# Patient Record
Sex: Female | Born: 1970
Health system: Northeastern US, Academic
[De-identification: ages and names within clinical notes are randomized; demographics above are authoritative.]

## PROBLEM LIST (undated history)

## (undated) DIAGNOSIS — F329 Major depressive disorder, single episode, unspecified: Secondary | ICD-10-CM

## (undated) DIAGNOSIS — E669 Obesity, unspecified: Secondary | ICD-10-CM

## (undated) DIAGNOSIS — M069 Rheumatoid arthritis, unspecified: Secondary | ICD-10-CM

## (undated) DIAGNOSIS — K219 Gastro-esophageal reflux disease without esophagitis: Secondary | ICD-10-CM

## (undated) DIAGNOSIS — D751 Secondary polycythemia: Secondary | ICD-10-CM

## (undated) DIAGNOSIS — N950 Postmenopausal bleeding: Secondary | ICD-10-CM

## (undated) DIAGNOSIS — F32A Depression, unspecified: Secondary | ICD-10-CM

## (undated) DIAGNOSIS — R0602 Shortness of breath: Secondary | ICD-10-CM

## (undated) DIAGNOSIS — R5383 Other fatigue: Secondary | ICD-10-CM

## (undated) DIAGNOSIS — F419 Anxiety disorder, unspecified: Secondary | ICD-10-CM

## (undated) HISTORY — PX: APPENDECTOMY: SHX54

## (undated) HISTORY — DX: Secondary polycythemia: D75.1

## (undated) HISTORY — DX: Major depressive disorder, single episode, unspecified: F32.9

## (undated) HISTORY — DX: Other fatigue: R53.83

## (undated) HISTORY — DX: Depression, unspecified: F32.A

## (undated) HISTORY — DX: Shortness of breath: R06.02

## (undated) HISTORY — PX: CHOLECYSTECTOMY: SHX55

## (undated) HISTORY — DX: Anxiety disorder, unspecified: F41.9

## (undated) HISTORY — DX: Obesity, unspecified: E66.9

## (undated) HISTORY — PX: OTHER SURGICAL HISTORY: SHX169

---
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
## 2000-01-13 HISTORY — PX: LAPAROSCOPIC UNILATERAL SALPINGECTOMY: SHX5934

## 2002-01-12 HISTORY — PX: HYSTEROSCOPY W/ ENDOMETRIAL ABLATION: SUR665

## 2003-07-06 ENCOUNTER — Ambulatory Visit (HOSPITAL_COMMUNITY): Admission: RE | Admit: 2003-07-06 | Discharge: 2003-07-06 | Payer: Self-pay | Admitting: Orthopaedic Surgery

## 2003-09-13 ENCOUNTER — Emergency Department (HOSPITAL_COMMUNITY): Admission: EM | Admit: 2003-09-13 | Discharge: 2003-09-14 | Payer: Self-pay | Admitting: *Deleted

## 2005-09-17 ENCOUNTER — Ambulatory Visit: Payer: Self-pay | Admitting: Oncology

## 2005-09-17 LAB — CBC WITH DIFFERENTIAL (CANCER CENTER ONLY)
BASO#: 0.1 10*3/uL (ref 0.0–0.2)
BASO%: 1.1 % (ref 0.0–2.0)
HCT: 45.6 % (ref 34.8–46.6)
HGB: 15.5 g/dL (ref 11.6–15.9)
LYMPH#: 2.9 10*3/uL (ref 0.9–3.3)
LYMPH%: 31.3 % (ref 14.0–48.0)
MCH: 29.3 pg (ref 26.0–34.0)
MCHC: 34 g/dL (ref 32.0–36.0)
MCV: 86 fL (ref 81–101)
MONO#: 0.4 10*3/uL (ref 0.1–0.9)
MONO%: 4.1 % (ref 0.0–13.0)
NEUT%: 61.4 % (ref 39.6–80.0)
Platelets: 306 10*3/uL (ref 145–400)
RBC: 5.3 10*6/uL (ref 3.70–5.32)
RDW: 11.5 % (ref 10.5–14.6)

## 2005-09-17 LAB — MORPHOLOGY - CHCC SATELLITE: Platelet Morphology: NORMAL

## 2005-09-18 LAB — COMPREHENSIVE METABOLIC PANEL
ALT: 31 U/L (ref 0–40)
AST: 21 U/L (ref 0–37)
Alkaline Phosphatase: 80 U/L (ref 39–117)
BUN: 9 mg/dL (ref 6–23)
CO2: 27 mEq/L (ref 19–32)
Glucose, Bld: 81 mg/dL (ref 70–99)
Total Protein: 7 g/dL (ref 6.0–8.3)

## 2005-09-18 LAB — ERYTHROPOIETIN: Erythropoietin: 7 m[IU]/mL (ref 2.6–34.0)

## 2005-09-18 LAB — IRON AND TIBC: %SAT: 23 % (ref 20–55)

## 2005-11-02 ENCOUNTER — Ambulatory Visit: Payer: Self-pay | Admitting: Oncology

## 2006-09-08 ENCOUNTER — Encounter: Payer: Self-pay | Admitting: Obstetrics & Gynecology

## 2006-09-08 ENCOUNTER — Ambulatory Visit: Payer: Self-pay | Admitting: Obstetrics & Gynecology

## 2006-10-13 ENCOUNTER — Ambulatory Visit: Payer: Self-pay | Admitting: Obstetrics & Gynecology

## 2007-12-26 DIAGNOSIS — D751 Secondary polycythemia: Secondary | ICD-10-CM | POA: Insufficient documentation

## 2007-12-26 DIAGNOSIS — Z8249 Family history of ischemic heart disease and other diseases of the circulatory system: Secondary | ICD-10-CM | POA: Insufficient documentation

## 2007-12-26 DIAGNOSIS — F329 Major depressive disorder, single episode, unspecified: Secondary | ICD-10-CM | POA: Insufficient documentation

## 2007-12-26 DIAGNOSIS — F32A Depression, unspecified: Secondary | ICD-10-CM | POA: Insufficient documentation

## 2007-12-26 DIAGNOSIS — Z803 Family history of malignant neoplasm of breast: Secondary | ICD-10-CM | POA: Insufficient documentation

## 2009-06-20 ENCOUNTER — Ambulatory Visit: Payer: Self-pay | Admitting: Obstetrics and Gynecology

## 2009-06-20 LAB — CONVERTED CEMR LAB

## 2009-07-16 ENCOUNTER — Ambulatory Visit: Payer: Self-pay | Admitting: Family Medicine

## 2009-07-29 ENCOUNTER — Ambulatory Visit: Payer: Self-pay | Admitting: Cardiovascular Disease

## 2009-07-29 DIAGNOSIS — R0602 Shortness of breath: Secondary | ICD-10-CM

## 2009-07-29 DIAGNOSIS — R5383 Other fatigue: Secondary | ICD-10-CM

## 2009-07-29 DIAGNOSIS — R079 Chest pain, unspecified: Secondary | ICD-10-CM | POA: Insufficient documentation

## 2009-08-13 ENCOUNTER — Ambulatory Visit: Payer: Self-pay | Admitting: Internal Medicine

## 2009-08-14 ENCOUNTER — Telehealth: Payer: Self-pay | Admitting: Cardiovascular Disease

## 2009-10-19 ENCOUNTER — Emergency Department: Payer: Self-pay | Admitting: Emergency Medicine

## 2009-12-31 ENCOUNTER — Ambulatory Visit: Payer: Self-pay | Admitting: Family Medicine

## 2010-02-02 ENCOUNTER — Encounter: Payer: Self-pay | Admitting: Orthopaedic Surgery

## 2010-02-06 ENCOUNTER — Emergency Department: Payer: Self-pay | Admitting: Emergency Medicine

## 2010-02-12 NOTE — Letter (Signed)
Summary: Historic Patient File  Historic Patient File   Imported By: [place] 07/30/2009 11:20:04  _____________________________________________________________________  External Attachment:    Type:   Image     Comment:   External Document

## 2010-02-12 NOTE — Assessment & Plan Note (Signed)
Summary: NP6/GLC   Visit Type:  new pt visit Primary Provider:  [facility]  CC:  chest pain...sob...denies any edema.  History of Present Illness: Meghan [name] 40 yo woman with no known CAD or cardiac hx, who smokes 1ppd for the past 20 yrs, significant family hx of CAD, presents with chest pain since last fall. She sees Dr. Nilda Simmer.  She reports her chest discomfort has been getting more intense over the past few months. She has up to 4 epsides a week, each episode lasting a few minutes. there is no activity that will inducxe her signs, rather they seem to come and go. Nothing seems to make it better or worse. recently, she had an epside of SOB with her chest discomfort. She describes the feeling as a pressure. She also has fatigue.  She is very stressed though this is nothing new for her. She has a 41 yo and a 54 yo child, works full time and manages all the bills. She works at [employer] full time.  She has been trying to quit smoking, was successful for two weeks, though started again.  No Labs available  EKG: NSR with rate 67 bpm, no significant ST or T wave changes.   Preventive Screening-Counseling & Management  Alcohol-Tobacco     Smoking Status: never  Caffeine-Diet-Exercise     Does Patient Exercise: yes      Drug Use:  no.    Current Medications (verified): 1)  Klonopin 1 Mg Tabs (Clonazepam) .Marland Kitchen.. 1 Tab Once Daily  Allergies (verified): 1)  ! Sulfa  Past History:  Family History: Last updated: 07/29/2009 Family History of Coronary Artery Disease: Father had 1st MI in his 20 yrs..had CABG and has defibrillator Family History of Hyperlipidemia: Mother Family History of Cancer: Mother Family History of Diabetes: Father Family History of Depression: Father  Social History: Last updated: 07/29/2009 Full Time Married  Tobacco Use - No.  Alcohol Use - no Regular Exercise - yes Drug Use - no  Risk Factors: Exercise: yes (07/29/2009)  Risk  Factors: Smoking Status: never (07/29/2009)  Past Medical History: FATIGUE (ICD-780.79) SHORTNESS OF BREATH (ICD-786.05) CHEST PAIN (ICD-786.50) Depression  Past Surgical History: Appendectomy Cholecystectomy Left salping/oopherectomy.Marland Kitchen.(ectopic pregnancy) Endometrial abalation/tubal ligation  Family History: Family History of Coronary Artery Disease: Father had 1st MI in his 20 yrs..had CABG and has defibrillator Family History of Hyperlipidemia: Mother Family History of Cancer: Mother Family History of Diabetes: Father Family History of Depression: Father  Social History: Full Time Married  Tobacco Use - No.  Alcohol Use - no Regular Exercise - yes Drug Use - no Smoking Status:  never Does Patient Exercise:  yes Drug Use:  no  Review of Systems       The patient complains of chest pain.  The patient denies fever, weight loss, weight gain, vision loss, decreased hearing, hoarseness, syncope, dyspnea on exertion, peripheral edema, prolonged cough, abdominal pain, incontinence, muscle weakness, depression, and enlarged lymph nodes.         Fatigue, rare SOB with chest pressure  Vital Signs:  Patient profile:   40 year old female Height:      66 inches Weight:      183 pounds BMI:     29.64 Pulse rate:   67 / minute Pulse rhythm:   regular BP sitting:   122 / 85  (left arm) Cuff size:   large  Vitals Entered By: Danielle Rankin, CMA (July 29, 2009 11:03 AM)  Physical Exam  General:  Well developed, well nourished, in no acute distress. Head:  normocephalic and atraumatic Neck:  Neck supple, no JVD. No masses, thyromegaly or abnormal cervical nodes. Lungs:  Clear bilaterally to auscultation and percussion. Heart:  Non-displaced PMI, chest non-tender; regular rate and rhythm, S1, S2 without murmurs, rubs or gallops. Carotid upstroke normal, no bruit.  Pedals normal pulses. No edema, no varicosities. Abdomen:  Bowel sounds positive; abdomen soft and non-tender  without masses Msk:  Back normal, normal gait. Muscle strength and tone normal. Pulses:  pulses normal in all 4 extremities Extremities:  No clubbing or cyanosis. Neurologic:  Alert and oriented x 3. Skin:  Intact without lesions or rashes. Psych:  Normal affect.   Problems:  Medical Problems Added: 1)  Dx of Fatigue  (ICD-780.79) 2)  Dx of Shortness of Breath  (ICD-786.05) 3)  Dx of Chest Pain  (ICD-786.50)  Impression & Recommendations:  Problem # 1:  CHEST PAIN (ICD-786.50) Assessment Improved Etiology of her pain is uncertain.There are some typical as well as atypical features of her symptoms.  Her risk factors include smoking and strong family hx. Will will set her up for a routine treadmill test.   Other differential could include esophageal spasm, coronary spasm, haital hernia, etc. If her treadmill is negative, we could try low dose imdur as needed or ca channel blockers. Medical management will be difficult as her signs do not last a long time and are rare. She prefers not to start medications.   Future Orders: Treadmill (Treadmill) ... 08/15/2009  Patient Instructions: 1)  Your physician recommends that you schedule a follow-up appointment in: as needed. 2)  Your physician has requested that you have an exercise tolerance test.  For further information please visit [email or web address]  Please also follow instruction sheet, as given. 3)  August 15, 2009 @ 7:30 a.m.

## 2010-02-12 NOTE — Progress Notes (Signed)
Summary: Reschedule ETT    Call placed by: Bishop Dublin, CMA,  August 14, 2009 8:45 AM Call placed to: Patient Details for Reason: reschedule [facility] ETT to August 22, 2009 Summary of Call: Need to let patient know we need to reschedule ETT at [facility] to August 22, 2009.  Initial call taken by: Bishop Dublin, CMA,  August 14, 2009 8:46 AM    Additional Follow-up for Phone Call Additional follow up Details #2::    that is ok.    Appended Document: Reschedule ETT [facility] rescheduled ETT for September 02, 2009 to arrive at 7:30 for a 8:00 stress test.  She is to call back if date okay.  Appended Document: Reschedule ETT Patient called to cancel her ETT for now because she is having so many test for pulmonolgy reasons and she will call back to reschedule.  She feels most of her problem is her lungs and she feels needs to deal with this issue first.

## 2010-04-17 ENCOUNTER — Ambulatory Visit: Payer: Self-pay | Admitting: Obstetrics & Gynecology

## 2010-04-28 ENCOUNTER — Ambulatory Visit: Payer: 59 | Admitting: Obstetrics & Gynecology

## 2010-04-28 ENCOUNTER — Ambulatory Visit: Payer: Self-pay | Admitting: Obstetrics & Gynecology

## 2010-04-28 DIAGNOSIS — N393 Stress incontinence (female) (male): Secondary | ICD-10-CM

## 2010-04-28 DIAGNOSIS — N949 Unspecified condition associated with female genital organs and menstrual cycle: Secondary | ICD-10-CM

## 2010-04-29 NOTE — Assessment & Plan Note (Signed)
NAME:  Meghan, [name] NO.:  [id number]  MEDICAL RECORD NO.:  [id number]           PATIENT TYPE:  LOCATION:  [facility] at [facility]           FACILITY:  PHYSICIAN:  Allie Bossier, MD             DATE OF BIRTH:  DATE OF SERVICE:  04/28/2010                                 CLINIC NOTE  Meghan [name] is a 40 year old married white G3, P2, ectopic one, she has 47 and 40 year old children and she comes in here today for followup of her pelvic pain.  She says her pelvic pain is consistently on the left.  It is intermittent.  She says it is more frequent when she is having vaginal bleeding.  Please note that she has a history of dysfunctional uterine bleeding and actually had an endometrial ablation in 2003, but she says her vaginal bleeding now occurs up to frequently at two times a month.  She has not had a recent ultrasound.  Dr. Okey Dupre saw her on June 20, 2009, and at that time, felt that she would benefit from a laparoscopy.  I agree with his assessment; however, I believe an ultrasound would be appropriate.  She also complains of excessive hair growth and would "like my hormone levels checked."  ASSESSMENT AND PLAN: 1. Pelvic pain.  I am ordering a GYN ultrasound and cervical cultures. 2. The patient appreciated hirsutism.  I am ordering a free     testosterone a TSH and an FSH.  When she comes back, I will review     the ultrasound and I plan to offer her laparoscopy at that time.  Please note, her main problem today is that of genuine stress urinary incontinence.  She says anytime she works out, she has to wear pads. She also has stress incontinence any time she laughs, coughs, and sneezes.  She has heard about a sling and would like one, so when I offer her a laparoscopy, I will certainly plan on doing an Advantage Fit sling.     Allie Bossier, MD    MCD/MEDQ  D:  04/28/2010  T:  04/29/2010  Job:  [id number]

## 2010-05-01 ENCOUNTER — Other Ambulatory Visit: Payer: Self-pay | Admitting: Obstetrics & Gynecology

## 2010-05-01 DIAGNOSIS — R102 Pelvic and perineal pain: Secondary | ICD-10-CM

## 2010-05-02 ENCOUNTER — Ambulatory Visit (HOSPITAL_COMMUNITY): Payer: 59

## 2010-05-02 ENCOUNTER — Ambulatory Visit (HOSPITAL_COMMUNITY)
Admission: RE | Admit: 2010-05-02 | Discharge: 2010-05-02 | Disposition: A | Discharge status: Home / Self Care | Payer: 59 | Source: Ambulatory Visit | Attending: Obstetrics & Gynecology | Admitting: Obstetrics & Gynecology

## 2010-05-02 DIAGNOSIS — R102 Pelvic and perineal pain: Secondary | ICD-10-CM

## 2010-05-27 NOTE — Assessment & Plan Note (Signed)
NAME:  Meghan [name], Meghan [name] NO.:  [id number]   MEDICAL RECORD NO.:  [id number]          PATIENT TYPE:  POB   LOCATION:  [facility] at [facility]         FACILITY:  [facility]   PHYSICIAN:  Argentina Donovan, MD        DATE OF BIRTH:  1970-05-16   DATE OF SERVICE:                                  CLINIC NOTE   The patient is a 40 year old Caucasian female gravida 3, para 2-0-1-2,  with a history of ectopic pregnancy and left salpingectomy as well as  sterilization by laparoscopic clip on her right ovary, endometrial  ablation, cholecystectomy, and appendectomy.  Recently diagnosed by a  psychologist with attention deficit hyperactivity disorder for which she  has not yet been placed on medication.  Medication she is on is Klonopin  and ibuprofen which is about she can take for her pain.  The pain is  intermittent but severe when it comes and more frequent when she is  having episodes of vaginal bleeding which she is getting about 2 times  per month.  The patient complains that most of this pain being in the  left lower quadrant and the last time she had a laparoscopy which was  when she had her tubal ligation done.  There was not any significant  signs of pelvic adhesions.  Since the pain is mainly coming with her  bleeding episodes, adenomyosis has to be certainly considered although  this would not explain why the pain is mainly on the left side.  Pelvic  congestion syndrome, endometriosis, and pelvic adhesions appearing since  her last laparoscopy are other considerations, and the patient is fairly  convinced that this is a gynecological etiology which I guess has to be  considered until proven otherwise.  She was previously scheduled for a  diagnostic laparoscopy and that was cancelled because the patient told  me she felt that she had been pushing the doctor to do that.  At this  point, I do not know of any other solution except a follow through with  that.   PHYSICAL  EXAMINATION:  VITAL SIGNS:  Today, the patient's blood pressure  was 118/89, her weight is 186, 5 feet 6 inches tall.  GENERAL:  Well-developed, well-nourished Caucasian female in no acute  distress.  HEENT:  Within normal limits.  NECK:  Supple.  Thyroid symmetrical.  No masses.  LUNGS:  Clear to auscultation and percussion.  BACK:  Erect with no CVA tenderness.  BREASTS:  Symmetrical.  No dominant masses, no nipple discharge.  No  supraclavicular or axillary nodes.  HEART:  No murmur.  Normal sinus rhythm.  ABDOMEN:  Soft, flat, nontender.  No masses, no organomegaly noted.  External genitalia is normal.  BUS within normal limits.  Vagina is  clean and well rugated.  Cervix is clean and parous.  Pap smear was  taken.  The uterus is anterior with normal size, shape, consistency.  The adnexa not well outlined, but does not seem to be abnormal.  Cul-de-  sac is free.  EXTREMITIES:  No edema.  No varicosities.  DTRs within normal limits.  Physical examination is generally normal.   IMPRESSION:  Chronic recurrent pelvic  pain with intermittent  dysfunctional uterine bleeding.  I am going to have the patient come  back and see other surgeons, so that she can probably be scheduled for a  diagnostic laparoscopy.  Until this is done, I do not think we can  possibly say that we do not have any evidence that this is of  gynecological  etiology, although I have my doubts the patient's mother had  hysterectomy for the same reason as did some other relatives.           ______________________________  Argentina Donovan, MD     PR/MEDQ  D:  06/20/2009  T:  06/21/2009  Job:  [id number]

## 2010-05-27 NOTE — H&P (Signed)
NAME:  Meghan [name], Meghan [name]             ACCOUNT NO.:  [id number]   MEDICAL RECORD NO.:  [id number]          PATIENT TYPE:  POB   LOCATION:  [facility] at [facility]         FACILITY:  [facility]   PHYSICIAN:  Johnella Moloney, MD        DATE OF BIRTH:  [birth year]   DATE OF SERVICE:  09/08/2006                           PRE-OP HISTORY & PHYSICAL   CHIEF COMPLAINT:  Pelvic pain.   HISTORY OF PRESENT ILLNESS:  Patient is a 40 year old G3, P2-0-1-2 who  comes to the office today complaining of worsening pelvic pain.  Of  note, the patient has a history of left salpingectomy for an ectopic  pregnancy in 2002 and a thermal endometrial ablation and right-sided  tubal binding in 2004.  The patient reports that she has had this pelvic  pain for over a year.  It initially started off as a dull pain that  occurred a few days prior to her period but has gotten worse to the  point that it becomes debilitated, on some days requiring 1600 mg of  Motrin every 6 hours as needed.  She also reports a throbbing on the  left side that is sometimes present when she is having these pain  episodes.  The patient does not notice that there is any clear  association with this pain, urinary or bowel symptoms, but does report  sometimes that the pain interferes with her abilities to defecate or  urinate.  Patient does say that the pain is related to when she is  getting her bleeding episodes, which do occur about every 2-3 weeks, at  which point she has a small amount of bleeding and this has been routine  since her endometrial ablation.  The patient is currently sexually  active with a monogamous partner and has no other symptoms.  She denies  any fever, chills, sweats, abnormal vaginal discharge, or any other  symptoms.   PAST MEDICAL HISTORY:  1. Polycythemia.  2. Anxiety.   PAST SURGICAL HISTORY:  1. Salpingectomy for ectopic ablation and right tubal banding.  2. Cholecystectomy.  3. Appendectomy.   MEDICATIONS:  1.  Klonopin 1 mg p.o. p.r.n.  2. Ibuprofen 800 mg p.o. p.r.n. pain.   ALLERGIES:  SULFA.   SOCIAL HISTORY:  Patient works as an Print production planner.  She has smoked  over one pack per day for the last 20 years.  She denies any illicit  drug use or alcohol use and denies any domestic violence.   FAMILY HISTORY:  Patient reports an extensive history of pelvic pain on  her maternal side, including her mother and all of her aunts, who all  had to get hysterectomies to deal with her pelvic pain and also reports  a brother who was schizophrenic and comitted suicide.  No GYN cancers  reported.  Of note, the patient has an extensive history of heart  disease with her father, who has had two quadruple bypass surgeries, and  according to the patient, has a very complicated heart disease history.   OB/GYN HISTORY:  She is G3, P2-0-1-2.  She has had two vaginal  deliveries.  She had a history of irregular heavy periods prior to her  endometrial  ablation and now has minimal bleeding every two weeks.  Patient reported having a Pap smear in February, 2007, which came back  as ASCUS negative, HPV, and no prior history of abnormal Pap smears.  Patient denies any sexually transmitted diseases.   PHYSICAL EXAMINATION:  Pulse 80, blood pressure 125/90.  Weight is  181.5.  Her last menstrual period was September 01, 2006.  GENERAL:  No apparent distress.  LUNGS:  Clear to auscultation bilaterally.  HEART:  Regular rate and rhythm.  ABDOMEN:  Soft.  Minimal tenderness on deep palpation diffusely.  No  rebound or guarding.  PELVIC:  Normal external female genitalia.  No abnormal vaginal  discharge.  Pap smear and GC Chlamydia sample obtained.  On bimanual  exam, anteverted uterus.  No cervical motion tenderness but just diffuse  tenderness on palpation of the uterus and bilateral adnexa.  No abnormal  masses palpated.   ASSESSMENT/PLAN:  Patient is a 40 year old G3, P2 with pelvic pain.  Patient's pain  description is concerning for endometriosis versus  adhesions from her prior surgery, especially on her left side. The  patient was counseled regarding treatment with different NSAIDs, but  patient does opt for surgical evaluation. Patient was counseled  regarding a diagnostic laparoscopy to evaluate for possible  endometriosis or pelvic adhesions, at which point these will be either  ablated or removed.  She was also talked to about the chance of doing  this surgery and not finding any etiology for her pain, and patient is  understanding of this possibility but wants to proceed with surgery.  The risks, benefits, indications, and alternatives to the exploratory  laparoscopy were discussed, and patient verbalized understanding of the  plan.  Will go ahead and schedule the patient for the laparoscopy and  preop order sheets will be sent over to [facility] for this effect.           ______________________________  Johnella Moloney, MD     UD/MEDQ  D:  09/08/2006  T:  09/08/2006  Job:  [id number]

## 2010-07-28 ENCOUNTER — Encounter: Payer: Self-pay | Admitting: Cardiovascular Disease

## 2010-08-13 ENCOUNTER — Other Ambulatory Visit (INDEPENDENT_AMBULATORY_CARE_PROVIDER_SITE_OTHER): Payer: 59 | Admitting: *Deleted

## 2010-08-13 DIAGNOSIS — N912 Amenorrhea, unspecified: Secondary | ICD-10-CM

## 2010-08-13 LAB — POCT URINE PREGNANCY: Preg Test, Ur: NEGATIVE

## 2010-08-13 NOTE — Progress Notes (Signed)
Patient has not had a cycle in several months, the last one she remembers is April or may. She has had an endometrial ablation but it was 10 years ago and has always had a cycle.  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

## 2010-09-08 ENCOUNTER — Telehealth: Payer: Self-pay | Admitting: *Deleted

## 2010-09-08 DIAGNOSIS — R1032 Left lower quadrant pain: Secondary | ICD-10-CM

## 2010-09-08 NOTE — Telephone Encounter (Signed)
Patient would like to move forward with Pelvic Ultrasound.

## 2010-09-19 ENCOUNTER — Ambulatory Visit (HOSPITAL_COMMUNITY)
Admission: RE | Admit: 2010-09-19 | Discharge: 2010-09-19 | Disposition: A | Discharge status: Home / Self Care | Payer: 59 | Source: Ambulatory Visit | Attending: Obstetrics and Gynecology | Admitting: Obstetrics and Gynecology

## 2010-09-19 DIAGNOSIS — N949 Unspecified condition associated with female genital organs and menstrual cycle: Secondary | ICD-10-CM | POA: Insufficient documentation

## 2010-09-19 DIAGNOSIS — D251 Intramural leiomyoma of uterus: Secondary | ICD-10-CM | POA: Insufficient documentation

## 2010-09-19 DIAGNOSIS — R1032 Left lower quadrant pain: Secondary | ICD-10-CM

## 2010-10-06 ENCOUNTER — Encounter: Payer: Self-pay | Admitting: Obstetrics & Gynecology

## 2010-10-06 ENCOUNTER — Ambulatory Visit (INDEPENDENT_AMBULATORY_CARE_PROVIDER_SITE_OTHER): Payer: 59 | Admitting: Obstetrics & Gynecology

## 2010-10-06 VITALS — BP 111/81 | HR 64 | Ht 66.0 in | Wt 194.0 lb

## 2010-10-06 DIAGNOSIS — N949 Unspecified condition associated with female genital organs and menstrual cycle: Secondary | ICD-10-CM

## 2010-10-06 DIAGNOSIS — N943 Premenstrual tension syndrome: Secondary | ICD-10-CM

## 2010-10-06 DIAGNOSIS — R32 Unspecified urinary incontinence: Secondary | ICD-10-CM

## 2010-10-06 NOTE — Progress Notes (Signed)
Here today to discuss possible surgery and review pelvic ultrasound.

## 2010-10-06 NOTE — Progress Notes (Signed)
  Subjective:    Patient ID: Meghan [name], female    DOB: [birth year], 40 y.o.   MRN: [id number]  HPI Ms. Garlitz is here to discuss her incontinence and left pelvic pain.  She is interested in a sling and laparoscopy.  Upon further discussion of her incontinence, it seems that probably half of her incontinence episodes are from urge incontinence.     Review of Systems She says that her DUB seems to have resolved.  She has only had one period in the last 4 months    Objective:   Physical Exam Generous pubic arch Uterus ULN size, NT, mobile, AV Adnexa non-tender and no masses       Assessment & Plan:  Mixed incontinence- Prior to a sling, I would like for her to have a urology consult When she does go to the OR, I will do a laparoscopy and look for a reason for her pain.

## 2011-04-06 ENCOUNTER — Ambulatory Visit (INDEPENDENT_AMBULATORY_CARE_PROVIDER_SITE_OTHER): Payer: 59 | Admitting: Obstetrics & Gynecology

## 2011-04-06 ENCOUNTER — Encounter: Payer: Self-pay | Admitting: Obstetrics & Gynecology

## 2011-04-06 VITALS — BP 131/90 | HR 80 | Ht 66.0 in | Wt 204.0 lb

## 2011-04-06 DIAGNOSIS — N939 Abnormal uterine and vaginal bleeding, unspecified: Secondary | ICD-10-CM

## 2011-04-06 DIAGNOSIS — N938 Other specified abnormal uterine and vaginal bleeding: Secondary | ICD-10-CM

## 2011-04-06 NOTE — Progress Notes (Signed)
  Subjective:    Patient ID: Meghan [name], female    DOB: [birth year], 41 y.o.   MRN: [id number]  HPI Meghan [name] is a 41 yo lady status post endometrial ablation 2003. Her periods have continued since the ablation but have become erratic. She skipped her period for 4 months and now has been bleeding for the last 3 weeks. She is at the point that she would like a "partial hysterectomy" and a sling at the same time.   Review of Systems U/s normal 9/12    Objective:   Physical Exam   ULN size, mobile, NT, no adnexal masses.     Assessment & Plan:  DUB after ablation. I will check CBC and TSH. She will go to the urologist for a consult.

## 2011-04-06 NOTE — Progress Notes (Signed)
Addended by: Barbara Cower on: 04/06/2011 04:40 PM   Modules accepted: Orders

## 2011-04-06 NOTE — Progress Notes (Signed)
No period for four months then bled for a week the end of feb beginning of March. She stopped bleeding for a week then started back and has bled up until today.  She has had an ablation, and is interested in surgical management at this point.  She also loses urine with coughing and sneezing.  She has also gained 10  Pounds since her visit in September.  She has stopped smoking completely.  She has started exercising as well but the weight is not budging.

## 2011-04-06 NOTE — Progress Notes (Signed)
Addended by: Barbara Cower on: 04/06/2011 04:05 PM   Modules accepted: Orders

## 2011-06-18 ENCOUNTER — Encounter: Payer: 59 | Attending: Obstetrics & Gynecology | Admitting: *Deleted

## 2011-06-18 ENCOUNTER — Encounter: Payer: Self-pay | Admitting: *Deleted

## 2011-06-18 DIAGNOSIS — E669 Obesity, unspecified: Secondary | ICD-10-CM | POA: Insufficient documentation

## 2011-06-18 DIAGNOSIS — Z713 Dietary counseling and surveillance: Secondary | ICD-10-CM | POA: Insufficient documentation

## 2011-06-18 NOTE — Patient Instructions (Signed)
Plan: Aim for 3 Carb Choices (45 grams) per meal +/- 1 either way Read food labels for total carb of foods Consider ways to increase activity level, perhaps kayaking! Consider dancing at home 15 minutes every day

## 2011-06-18 NOTE — Progress Notes (Signed)
  Medical Nutrition Therapy:  Appt start time: 1615 end time:  1715.  Assessment:  Primary concerns today: patient here for assistance with weight loss after quitting smoking about 18 months ago. 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 She is happily busy with errands, her church and kids' activities after work. She feels she eats healthy foods, but would like direction as to better eating & activity habits.  MEDICATIONS: see list   DIETARY INTAKE:  Usual eating pattern includes 3 meals and 2+ snacks per day.  Everyday foods include good variety of all food groups.  Avoided foods include high fat or sweet foods.    24-hr recall:  B ( AM): banana or other fruit, yogurt occasionally, English Muffin with 1 Tbsp PNB, coffee with Sweet and Low, Cream throughout the day  Snk ( AM): graze while working; raw vegetables, Cheese Puff, Cheez-its,  L ( PM): bring from home: [place] burger with Pita bread, cheese, no condiments OR tomato Mozzarella salad Snk ( PM): same as AM D ( PM): lean meat, vegetables, occasioanally starch Snk ( PM): none Beverages: coffee, water sometimes with Crystal Light  Usual physical activity: walk outside in neighborhood occasionally  Estimated energy needs: 1400 calories 158 g carbohydrates 105 g protein 39 g fat  Progress Towards Goal(s):  In progress.   Nutritional Diagnosis:  NI-1.5 Excessive energy intake As related to activity level.  As evidenced by BMI of 33.9.    Intervention:  Nutrition counseling provided. Taught Carb Counting and how to read food label. Also discussed options for increasing her activity level including kayaking which she has done recently. May consider dancing at home as well Plan: Aim for 3 Carb Choices (45 grams) per meal +/- 1 either way Read food labels for total carb of foods Consider ways to increase activity level, perhaps kayaking! Consider dancing at home 15 minutes every  day  Handouts given during visit include: Carb Counting and Food Label handouts Meal Plan Card [facility] Calendar  Monitoring/Evaluation:  Dietary intake, exercise, reading food labels, and body weight in 3 week(s).

## 2011-07-07 ENCOUNTER — Ambulatory Visit: Payer: 59 | Admitting: *Deleted

## 2011-08-10 ENCOUNTER — Other Ambulatory Visit (INDEPENDENT_AMBULATORY_CARE_PROVIDER_SITE_OTHER): Payer: 59 | Admitting: *Deleted

## 2011-08-10 DIAGNOSIS — N912 Amenorrhea, unspecified: Secondary | ICD-10-CM

## 2011-08-12 LAB — LUTEINIZING HORMONE: LH: 18.7 m[IU]/mL

## 2011-08-28 ENCOUNTER — Encounter: Payer: Self-pay | Admitting: Obstetrics & Gynecology

## 2011-10-27 ENCOUNTER — Ambulatory Visit: Payer: 59 | Admitting: Obstetrics & Gynecology

## 2011-11-24 ENCOUNTER — Encounter: Payer: Self-pay | Admitting: Obstetrics & Gynecology

## 2011-11-24 ENCOUNTER — Ambulatory Visit (INDEPENDENT_AMBULATORY_CARE_PROVIDER_SITE_OTHER): Payer: 59 | Admitting: Obstetrics & Gynecology

## 2011-11-24 VITALS — BP 130/93 | HR 75 | Ht 66.0 in | Wt 203.0 lb

## 2011-11-24 DIAGNOSIS — Z1231 Encounter for screening mammogram for malignant neoplasm of breast: Secondary | ICD-10-CM

## 2011-11-24 DIAGNOSIS — Z1151 Encounter for screening for human papillomavirus (HPV): Secondary | ICD-10-CM

## 2011-11-24 DIAGNOSIS — Z0000 Encounter for general adult medical examination without abnormal findings: Secondary | ICD-10-CM

## 2011-11-24 DIAGNOSIS — R635 Abnormal weight gain: Secondary | ICD-10-CM

## 2011-11-24 DIAGNOSIS — Z124 Encounter for screening for malignant neoplasm of cervix: Secondary | ICD-10-CM

## 2011-11-24 DIAGNOSIS — Z01419 Encounter for gynecological examination (general) (routine) without abnormal findings: Secondary | ICD-10-CM

## 2011-11-24 MED ORDER — NORETHIN ACE-ETH ESTRAD-FE 1-20 MG-MCG(24) PO TABS: 1.0000 | ORAL_TABLET | Freq: Every day | ORAL | Status: DC

## 2011-11-24 NOTE — Progress Notes (Signed)
Subjective:    Meghan [name] is a 41 y.o. female who presents for an annual exam. She is very concerned about her weight gain/inability to lose the weight she desires. She has lost 6 pounds in the last 6 months. She has seen a nutritionist also. She is also discouraged because, even though she had an endometrial ablation in 2003, she continues to have 5-7 day "heavy" periods monthly.  The patient is sexually active. GYN screening history: last pap: was normal. The patient wears seatbelts: yes. The patient participates in regular exercise: yes. Has the patient ever been transfused or tattooed?: no. The patient reports that there is not domestic violence in her life.   Menstrual History: OB History    Grav Para Term Preterm Abortions TAB SAB Ect Mult Living   2 2              Menarche age: 32 Patient's last menstrual period was 10/15/2011.    The following portions of the patient's history were reviewed and updated as appropriate: allergies, current medications, past family history, past medical history, past social history, past surgical history and problem list.  Review of Systems A comprehensive review of systems was negative.    Objective:    BP 130/93  Pulse 75  Ht 5\' 6"  (1.676 m)  Wt 203 lb (92.08 kg)  BMI 32.76 kg/m2  LMP 10/15/2011  General Appearance:    Alert, cooperative, no distress, appears stated age  Head:    Normocephalic, without obvious abnormality, atraumatic  Eyes:    PERRL, conjunctiva/corneas clear, EOM's intact, fundi    benign, both eyes  Ears:    Normal TM's and external ear canals, both ears  Nose:   Nares normal, septum midline, mucosa normal, no drainage    or sinus tenderness  Throat:   Lips, mucosa, and tongue normal; teeth and gums normal  Neck:   Supple, symmetrical, trachea midline, no adenopathy;    thyroid:  no enlargement/tenderness/nodules; no carotid   bruit or JVD  Back:     Symmetric, no curvature, ROM normal, no CVA tenderness  Lungs:      Clear to auscultation bilaterally, respirations unlabored  Chest Wall:    No tenderness or deformity   Heart:    Regular rate and rhythm, S1 and S2 normal, no murmur, rub   or gallop  Breast Exam:    No tenderness, masses, or nipple abnormality  Abdomen:     Soft, non-tender, bowel sounds active all four quadrants,    no masses, no organomegaly  Genitalia:    Normal female without lesion, discharge or tenderness, NSSA, NT, mobile, non-enlarged, non-tender adnexa     Extremities:   Extremities normal, atraumatic, no cyanosis or edema  Pulses:   2+ and symmetric all extremities  Skin:   Skin color, texture, turgor normal, no rashes or lesions  Lymph nodes:   Cervical, supraclavicular, and axillary nodes normal  Neurologic:   CNII-XII intact, normal strength, sensation and reflexes    throughout  .    Assessment:    Healthy female exam.    Plan:     Mammogram. Thin prep Pap smear. referral to Dr. Cathey Endow at the [facility] in [place]  I will start her on OCPs to try to regulate/lighten her periods. BP check and labs in 1 week

## 2011-12-04 ENCOUNTER — Other Ambulatory Visit: Payer: 59

## 2012-01-21 ENCOUNTER — Ambulatory Visit (HOSPITAL_COMMUNITY): Admission: RE | Admit: 2012-01-21 | Payer: 59 | Source: Ambulatory Visit

## 2012-04-21 ENCOUNTER — Other Ambulatory Visit (INDEPENDENT_AMBULATORY_CARE_PROVIDER_SITE_OTHER): Payer: 59 | Admitting: *Deleted

## 2012-04-21 DIAGNOSIS — Z13220 Encounter for screening for lipoid disorders: Secondary | ICD-10-CM

## 2012-04-21 DIAGNOSIS — R5383 Other fatigue: Secondary | ICD-10-CM

## 2012-04-23 LAB — LIPID PANEL: Cholesterol, Total: 177 mg/dL (ref 100–199)

## 2012-04-23 LAB — COMPREHENSIVE METABOLIC PANEL
Albumin: 4.4 g/dL (ref 3.5–5.5)
Alkaline Phosphatase: 97 IU/L (ref 39–117)
BUN/Creatinine Ratio: 10 (ref 9–23)
BUN: 9 mg/dL (ref 6–24)
CO2: 26 mmol/L (ref 19–28)
Creatinine, Ser: 0.86 mg/dL (ref 0.57–1.00)
GFR calc Af Amer: 96 mL/min/{1.73_m2} (ref >59)
GFR calc non Af Amer: 84 mL/min/{1.73_m2} (ref >59)
Globulin, Total: 4.4 g/dL (ref 1.5–4.5)
Total Bilirubin: 0.3 mg/dL (ref 0.0–1.2)

## 2012-04-23 LAB — CBC WITH DIFFERENTIAL/PLATELET
Basos: 0 % (ref 0–3)
Eosinophils Absolute: 0.1 10*3/uL (ref 0.0–0.4)
Immature Grans (Abs): 0 10*3/uL (ref 0.0–0.1)
MCH: 28.7 pg (ref 26.6–33.0)
MCV: 86 fL (ref 79–97)
Monocytes Absolute: 0.4 10*3/uL (ref 0.1–0.9)
Neutrophils Relative %: 63 % (ref 40–74)
RBC: 5.08 x10E6/uL (ref 3.77–5.28)

## 2012-04-23 LAB — TSH: TSH: 1.28 u[IU]/mL (ref 0.450–4.500)

## 2012-04-26 ENCOUNTER — Telehealth: Payer: Self-pay | Admitting: *Deleted

## 2012-04-26 DIAGNOSIS — E559 Vitamin D deficiency, unspecified: Secondary | ICD-10-CM

## 2012-04-26 MED ORDER — VITAMIN D (ERGOCALCIFEROL) 1.25 MG (50000 UNIT) PO CAPS: 50000.0000 [IU] | ORAL_CAPSULE | ORAL | Status: DC

## 2012-04-26 NOTE — Telephone Encounter (Signed)
Patient is calling for lab results.  Notified of abnormal vitamin D level and we will call in rx for Vitamin D 10,000 units.

## 2012-05-02 ENCOUNTER — Encounter: Payer: Self-pay | Admitting: *Deleted

## 2013-04-26 ENCOUNTER — Telehealth: Payer: Self-pay

## 2013-04-26 NOTE — Telephone Encounter (Signed)
Called in Dyflucan 150 mg tab to [facility] for a yeast infection she developed while on a antibiotic treatment. Symptoms itchy and thick white discharge were present.

## 2013-05-29 ENCOUNTER — Ambulatory Visit (INDEPENDENT_AMBULATORY_CARE_PROVIDER_SITE_OTHER): Payer: Private Health Insurance - Indemnity | Admitting: Obstetrics & Gynecology

## 2013-05-29 ENCOUNTER — Encounter: Payer: Self-pay | Admitting: Obstetrics & Gynecology

## 2013-05-29 VITALS — BP 109/90 | HR 100 | Ht 66.0 in | Wt 187.8 lb

## 2013-05-29 DIAGNOSIS — Z0000 Encounter for general adult medical examination without abnormal findings: Secondary | ICD-10-CM

## 2013-05-29 DIAGNOSIS — Z124 Encounter for screening for malignant neoplasm of cervix: Secondary | ICD-10-CM

## 2013-05-29 DIAGNOSIS — Z01419 Encounter for gynecological examination (general) (routine) without abnormal findings: Secondary | ICD-10-CM

## 2013-05-29 MED ORDER — EFLORNITHINE HCL 13.9 % EX CREA: TOPICAL_CREAM | CUTANEOUS | Status: DC

## 2013-05-29 NOTE — Progress Notes (Signed)
Subjective:    Meghan [name] is a 43 y.o. female who presents for an annual exam. She has not had a period since 12/14 ([place]). The patient has no complaints today. The patient is sexually active. GYN screening history: last pap: was normal. The patient wears seatbelts: yes. The patient participates in regular exercise: no. Has the patient ever been transfused or tattooed?: no. The patient reports that there is not domestic violence in her life. She has [place] ancestry and has facial hair that requires plucking.  Menstrual History: OB History   Grav Para Term Preterm Abortions TAB SAB Ect Mult Living   2 2              Menarche age: 28  No LMP recorded. Patient has had an ablation.    The following portions of the patient's history were reviewed and updated as appropriate: allergies, current medications, past family history, past medical history, past social history, past surgical history and problem list.  Review of Systems A comprehensive review of systems was negative. Married for 10 years, monogamous for 17 years. Works at [employer], needs mammogram   Objective:    BP 109/90  Pulse 100  Ht 5\' 6"  (1.676 m)  Wt 187 lb 12.8 oz (85.186 kg)  BMI 30.33 kg/m2  General Appearance:    Alert, cooperative, no distress, appears stated age  Head:    Normocephalic, without obvious abnormality, atraumatic  Eyes:    PERRL, conjunctiva/corneas clear, EOM's intact, fundi    benign, both eyes  Ears:    Normal TM's and external ear canals, both ears  Nose:   Nares normal, septum midline, mucosa normal, no drainage    or sinus tenderness  Throat:   Lips, mucosa, and tongue normal; teeth and gums normal  Neck:   Supple, symmetrical, trachea midline, no adenopathy;    thyroid:  no enlargement/tenderness/nodules; no carotid   bruit or JVD  Back:     Symmetric, no curvature, ROM normal, no CVA tenderness  Lungs:     Clear to auscultation bilaterally, respirations unlabored  Chest Wall:     No tenderness or deformity   Heart:    Regular rate and rhythm, S1 and S2 normal, no murmur, rub   or gallop  Breast Exam:    No tenderness, masses, or nipple abnormality  Abdomen:     Soft, non-tender, bowel sounds active all four quadrants,    no masses, no organomegaly  Genitalia:    Normal female without lesion, discharge or tenderness, shaved, NSSA, NT, mobile, normal adnexal exam     Extremities:   Extremities normal, atraumatic, no cyanosis or edema  Pulses:   2+ and symmetric all extremities  Skin:   Skin color, texture, turgor normal, no rashes or lesions  Lymph nodes:   Cervical, supraclavicular, and axillary nodes normal  Neurologic:   CNII-XII intact, normal strength, sensation and reflexes    throughout  .    Assessment:    Healthy female exam.  Hirsuitism   Plan:     Breast self exam technique reviewed and patient encouraged to perform self-exam monthly. Mammogram. Thin prep Pap smear. with cotesting [place]

## 2013-06-05 LAB — PAP IG, CT-NG NAA, HPV HIGH-RISK
CHLAMYDIA, NUC. ACID AMP: NEGATIVE
GONOCOCCUS BY NUCLEIC ACID AMP: NEGATIVE

## 2013-11-13 ENCOUNTER — Encounter: Payer: Self-pay | Admitting: Obstetrics & Gynecology

## 2013-11-13 ENCOUNTER — Telehealth: Payer: Self-pay | Admitting: *Deleted

## 2013-11-13 DIAGNOSIS — N390 Urinary tract infection, site not specified: Secondary | ICD-10-CM

## 2013-11-13 MED ORDER — CEPHALEXIN 500 MG PO CAPS: 500.0000 mg | ORAL_CAPSULE | Freq: Three times a day (TID) | ORAL | Status: DC

## 2013-11-13 NOTE — Telephone Encounter (Signed)
Patient has a UTI and her supervisor is out of the office so she is unable to leave work to come in to be seen.  She would like something called in for her.

## 2014-04-18 ENCOUNTER — Encounter: Payer: Self-pay | Admitting: Physician Assistant

## 2014-04-18 ENCOUNTER — Ambulatory Visit (INDEPENDENT_AMBULATORY_CARE_PROVIDER_SITE_OTHER): Payer: 59 | Admitting: Physician Assistant

## 2014-04-18 VITALS — BP 135/87 | HR 82 | Ht 66.0 in

## 2014-04-18 DIAGNOSIS — N83202 Unspecified ovarian cyst, left side: Secondary | ICD-10-CM | POA: Insufficient documentation

## 2014-04-18 DIAGNOSIS — N8320 Unspecified ovarian cysts: Secondary | ICD-10-CM

## 2014-04-18 DIAGNOSIS — R102 Pelvic and perineal pain: Secondary | ICD-10-CM

## 2014-04-18 LAB — POCT URINALYSIS DIPSTICK
Bilirubin, UA: NEGATIVE
Blood, UA: NEGATIVE
GLUCOSE UA: NEGATIVE
Ketones, UA: NEGATIVE
LEUKOCYTES UA: NEGATIVE
Nitrite, UA: NEGATIVE
Protein, UA: NEGATIVE
UROBILINOGEN UA: NEGATIVE
pH, UA: 6

## 2014-04-18 MED ORDER — KETOROLAC TROMETHAMINE 60 MG/2ML IM SOLN
60.0000 mg | Freq: Once | INTRAMUSCULAR | Status: AC
  Administered 2014-04-18: 60 mg via INTRAMUSCULAR

## 2014-04-18 MED ORDER — HYDROCODONE-ACETAMINOPHEN 5-325 MG PO TABS: 1.0000 | ORAL_TABLET | Freq: Four times a day (QID) | ORAL | Status: DC | PRN

## 2014-04-18 MED ORDER — IBUPROFEN 800 MG PO TABS: 800.0000 mg | ORAL_TABLET | Freq: Three times a day (TID) | ORAL | Status: DC | PRN

## 2014-04-18 MED ORDER — ONDANSETRON 8 MG PO TBDP: 8.0000 mg | ORAL_TABLET | Freq: Three times a day (TID) | ORAL | Status: DC | PRN

## 2014-04-18 NOTE — Patient Instructions (Signed)
Ovarian Cyst An ovarian cyst is a fluid-filled sac that forms on an ovary. The ovaries are small organs that produce eggs in women. Various types of cysts can form on the ovaries. Most are not cancerous. Many do not cause problems, and they often go away on their own. Some may cause symptoms and require treatment. Common types of ovarian cysts include:  Functional cysts--These cysts may occur every month during the menstrual cycle. This is normal. The cysts usually go away with the next menstrual cycle if the woman does not get pregnant. Usually, there are no symptoms with a functional cyst.  Endometrioma cysts--These cysts form from the tissue that lines the uterus. They are also called "chocolate cysts" because they become filled with blood that turns brown. This type of cyst can cause pain in the lower abdomen during intercourse and with your menstrual period.  Cystadenoma cysts--This type develops from the cells on the outside of the ovary. These cysts can get very big and cause lower abdomen pain and pain with intercourse. This type of cyst can twist on itself, cut off its blood supply, and cause severe pain. It can also easily rupture and cause a lot of pain.  Dermoid cysts--This type of cyst is sometimes found in both ovaries. These cysts may contain different kinds of body tissue, such as skin, teeth, hair, or cartilage. They usually do not cause symptoms unless they get very big.  Theca lutein cysts--These cysts occur when too much of a certain hormone (human chorionic gonadotropin) is produced and overstimulates the ovaries to produce an egg. This is most common after procedures used to assist with the conception of a baby (in vitro fertilization). CAUSES   Fertility drugs can cause a condition in which multiple large cysts are formed on the ovaries. This is called ovarian hyperstimulation syndrome.  A condition called polycystic ovary syndrome can cause hormonal imbalances that can lead to  nonfunctional ovarian cysts. SIGNS AND SYMPTOMS  Many ovarian cysts do not cause symptoms. If symptoms are present, they may include:  Pelvic pain or pressure.  Pain in the lower abdomen.  Pain during sexual intercourse.  Increasing girth (swelling) of the abdomen.  Abnormal menstrual periods.  Increasing pain with menstrual periods.  Stopping having menstrual periods without being pregnant. DIAGNOSIS  These cysts are commonly found during a routine or annual pelvic exam. Tests may be ordered to find out more about the cyst. These tests may include:  Ultrasound.  X-ray of the pelvis.  CT scan.  MRI.  Blood tests. TREATMENT  Many ovarian cysts go away on their own without treatment. Your health care provider may want to check your cyst regularly for 2-3 months to see if it changes. For women in menopause, it is particularly important to monitor a cyst closely because of the higher rate of ovarian cancer in menopausal women. When treatment is needed, it may include any of the following:  A procedure to drain the cyst (aspiration). This may be done using a long needle and ultrasound. It can also be done through a laparoscopic procedure. This involves using a thin, lighted tube with a tiny camera on the end (laparoscope) inserted through a small incision.  Surgery to remove the whole cyst. This may be done using laparoscopic surgery or an open surgery involving a larger incision in the lower abdomen.  Hormone treatment or birth control pills. These methods are sometimes used to help dissolve a cyst. HOME CARE INSTRUCTIONS   Only take over-the-counter   or prescription medicines as directed by your health care provider.  Follow up with your health care provider as directed.  Get regular pelvic exams and Pap tests. SEEK MEDICAL CARE IF:   Your periods are late, irregular, or painful, or they stop.  Your pelvic pain or abdominal pain does not go away.  Your abdomen becomes  larger or swollen.  You have pressure on your bladder or trouble emptying your bladder completely.  You have pain during sexual intercourse.  You have feelings of fullness, pressure, or discomfort in your stomach.  You lose weight for no apparent reason.  You feel generally ill.  You become constipated.  You lose your appetite.  You develop acne.  You have an increase in body and facial hair.  You are gaining weight, without changing your exercise and eating habits.  You think you are pregnant. SEEK IMMEDIATE MEDICAL CARE IF:   You have increasing abdominal pain.  You feel sick to your stomach (nauseous), and you throw up (vomit).  You develop a fever that comes on suddenly.  You have abdominal pain during a bowel movement.  Your menstrual periods become heavier than usual. MAKE SURE YOU:  Understand these instructions.  Will watch your condition.  Will get help right away if you are not doing well or get worse. Document Released: 12/29/2004 Document Revised: 01/03/2013 Document Reviewed: 09/05/2012 ExitCare Patient Information 2015 [employer]. This information is not intended to replace advice given to you by your health care provider. Make sure you discuss any questions you have with your health care provider.  

## 2014-04-18 NOTE — Progress Notes (Signed)
Patient ID: Meghan [name], female   DOB: 02-Oct-1970, 44 y.o.   MRN: [id number] History:  Meghan [name] is a 44 y.o. G2P2 who presents to clinic today for pelvic pain present x 10 days without much change.  Period are highly irregular x greater than one year.  No recent strenuous physical activity/unusual sexual activity.  Not associated with eating/bathroom problems.  Denies vaginal bleeding, fever, weakness, nausea, vomiting, dizziness.    The following portions of the patient's history were reviewed and updated as appropriate: allergies, current medications, past family history, past medical history, past social history, past surgical history and problem list.  Review of Systems:  Pertinent items are noted in HPI.  Objective:  Physical Exam BP 135/87 mmHg  Pulse 82  Ht 5\' 6"  (1.676 m) GENERAL: Well-developed, well-nourished female in no acute distress.  LUNGS: Normal rate. No respiratory distress HEART: Regular rate  ABDOMEN: Soft, nondistended.  Tender to palpation across lower abdomen.    Labs and Imaging U/S done in clinic showing cyst 1.42cm on left ovary.    Assessment & Plan:  Assessment: 1. Pelvic pain in female   2. Ovarian cyst, left     Plans: IM Toradol in office Rx for Ibuprofen, Vicodin, Zofran. (zofran because pain meds cause nausea).  If no improvement, return to clinic.  If worsening, with severe pain - go to emergency room.  Return to annual exam next month.  Paticia Stack, PA-C 04/18/2014 3:19 PM

## 2014-04-18 NOTE — Progress Notes (Signed)
Bedside ultrasound shows Left ovarian simple cyst measuring 1.42cm.  Several other smaller follicles.  Patients discomfort is all along her lower pelvic region and has been going on for a full week now.

## 2014-08-28 ENCOUNTER — Ambulatory Visit (INDEPENDENT_AMBULATORY_CARE_PROVIDER_SITE_OTHER): Payer: 59 | Admitting: Obstetrics & Gynecology

## 2014-08-28 ENCOUNTER — Encounter: Payer: Self-pay | Admitting: Obstetrics & Gynecology

## 2014-08-28 VITALS — BP 135/89 | HR 82 | Wt 190.0 lb

## 2014-08-28 DIAGNOSIS — R102 Pelvic and perineal pain: Secondary | ICD-10-CM

## 2014-08-28 DIAGNOSIS — Z113 Encounter for screening for infections with a predominantly sexual mode of transmission: Secondary | ICD-10-CM

## 2014-08-28 NOTE — Progress Notes (Signed)
   Subjective:    Patient ID: Meghan [name], female    DOB: [birth year], 44 y.o.   MRN: [id number]  HPI  She is here for 2 reasons.  She is uncertain if she left a tampon in last week She continues to have what sounds like ovulation pain, it eases with IBU.   Review of Systems     Objective:   Physical Exam  [id number] Abd- benign Vagina- no foreign bodies Bimanual exam normal, NT      Assessment & Plan:  Pelvic pain- u/s prn Cervical cultures Pap and fasting labs next week

## 2014-08-29 LAB — GC/CHLAMYDIA PROBE AMP
CT PROBE, AMP APTIMA: NEGATIVE
GC PROBE AMP APTIMA: NEGATIVE

## 2014-09-04 ENCOUNTER — Ambulatory Visit: Admission: RE | Admit: 2014-09-04 | Payer: 59 | Source: Ambulatory Visit

## 2014-09-06 ENCOUNTER — Encounter: Payer: Self-pay | Admitting: Obstetrics & Gynecology

## 2014-09-06 ENCOUNTER — Ambulatory Visit (INDEPENDENT_AMBULATORY_CARE_PROVIDER_SITE_OTHER): Payer: 59 | Admitting: Obstetrics & Gynecology

## 2014-09-06 VITALS — BP 125/87 | HR 90 | Resp 16 | Ht 66.0 in | Wt 189.0 lb

## 2014-09-06 DIAGNOSIS — Z124 Encounter for screening for malignant neoplasm of cervix: Secondary | ICD-10-CM

## 2014-09-06 DIAGNOSIS — Z01419 Encounter for gynecological examination (general) (routine) without abnormal findings: Secondary | ICD-10-CM

## 2014-09-06 DIAGNOSIS — Z1151 Encounter for screening for human papillomavirus (HPV): Secondary | ICD-10-CM

## 2014-09-06 NOTE — Progress Notes (Signed)
Subjective:    Meghan [name] is a 44 y.o. female who presents for an annual exam. The patient has no complaints today. She has started to have some hot flashes. The patient is sexually active. GYN screening history: last pap: was normal. The patient wears seatbelts: yes. The patient participates in regular exercise: yes. Has the patient ever been transfused or tattooed?: no. The patient reports that there is not domestic violence in her life.   Menstrual History: OB History    Gravida Para Term Preterm AB TAB SAB Ectopic Multiple Living   2 2              Menarche age: 32  Patient's last menstrual period was 08/13/2014.    The following portions of the patient's history were reviewed and updated as appropriate: allergies, current medications, past family history, past medical history, past social history, past surgical history and problem list.  Review of Systems A comprehensive review of systems was negative. Married for 11 years but monogamous for 18 years. Works for [employer]   Objective:    BP 125/87 mmHg  Pulse 90  Resp 16  Ht 5\' 6"  (1.676 m)  Wt 189 lb (85.73 kg)  BMI 30.52 kg/m2  LMP 08/13/2014  General Appearance:    Alert, cooperative, no distress, appears stated age  Head:    Normocephalic, without obvious abnormality, atraumatic  Eyes:    PERRL, conjunctiva/corneas clear, EOM's intact, fundi    benign, both eyes  Ears:    Normal TM's and external ear canals, both ears  Nose:   Nares normal, septum midline, mucosa normal, no drainage    or sinus tenderness  Throat:   Lips, mucosa, and tongue normal; teeth and gums normal  Neck:   Supple, symmetrical, trachea midline, no adenopathy;    thyroid:  no enlargement/tenderness/nodules; no carotid   bruit or JVD  Back:     Symmetric, no curvature, ROM normal, no CVA tenderness  Lungs:     Clear to auscultation bilaterally, respirations unlabored  Chest Wall:    No tenderness or deformity   Heart:    Regular rate and  rhythm, S1 and S2 normal, no murmur, rub   or gallop  Breast Exam:    No tenderness, masses, or nipple abnormality  Abdomen:     Soft, non-tender, bowel sounds active all four quadrants,    no masses, no organomegaly  Genitalia:    Normal female without lesion, discharge or tenderness, NSSA, NT, mobile, normal adnexal exam     Extremities:   Extremities normal, atraumatic, no cyanosis or edema  Pulses:   2+ and symmetric all extremities  Skin:   Skin color, texture, turgor normal, no rashes or lesions  Lymph nodes:   Cervical, supraclavicular, and axillary nodes normal  Neurologic:   CNII-XII intact, normal strength, sensation and reflexes    throughout  .    Assessment:    Healthy female exam.    Plan:     Breast self exam technique reviewed and patient encouraged to perform self-exam monthly. Mammogram. Thin prep Pap smear. with cotesting (She is aware of ACOG recs) Fasting labs including [facility]

## 2014-09-07 LAB — COMPREHENSIVE METABOLIC PANEL
A/G RATIO: 0.9 — AB (ref 1.1–2.5)
ALT: 21 IU/L (ref 0–32)
AST: 15 IU/L (ref 0–40)
Albumin: 4.2 g/dL (ref 3.5–5.5)
Alkaline Phosphatase: 104 IU/L (ref 39–117)
BUN/Creatinine Ratio: 17 (ref 9–23)
BUN: 13 mg/dL (ref 6–24)
Bilirubin Total: 0.4 mg/dL (ref 0.0–1.2)
CALCIUM: 9.4 mg/dL (ref 8.7–10.2)
CO2: 23 mmol/L (ref 18–29)
CREATININE: 0.77 mg/dL (ref 0.57–1.00)
Chloride: 100 mmol/L (ref 97–108)
GFR, EST AFRICAN AMERICAN: 109 mL/min/{1.73_m2} (ref >59)
GFR, EST NON AFRICAN AMERICAN: 94 mL/min/{1.73_m2} (ref >59)
Globulin, Total: 4.5 g/dL (ref 1.5–4.5)
Glucose: 85 mg/dL (ref 65–99)
POTASSIUM: 4.3 mmol/L (ref 3.5–5.2)
Sodium: 139 mmol/L (ref 134–144)
TOTAL PROTEIN: 8.7 g/dL — AB (ref 6.0–8.5)

## 2014-09-07 LAB — CBC
HEMOGLOBIN: 15 g/dL (ref 11.1–15.9)
Hematocrit: 43.7 % (ref 34.0–46.6)
MCH: 29 pg (ref 26.6–33.0)
MCHC: 34.3 g/dL (ref 31.5–35.7)
MCV: 84 fL (ref 79–97)
Platelets: 257 10*3/uL (ref 150–379)
RBC: 5.18 x10E6/uL (ref 3.77–5.28)
RDW: 13.9 % (ref 12.3–15.4)
WBC: 5.7 10*3/uL (ref 3.4–10.8)

## 2014-09-07 LAB — FOLLICLE STIMULATING HORMONE: FSH: 55 m[IU]/mL

## 2014-09-07 LAB — LIPID PANEL
CHOLESTEROL TOTAL: 181 mg/dL (ref 100–199)
Chol/HDL Ratio: 5 ratio units — ABNORMAL HIGH (ref 0.0–4.4)
HDL: 36 mg/dL — AB (ref >39)
LDL CALC: 117 mg/dL — AB (ref 0–99)
TRIGLYCERIDES: 140 mg/dL (ref 0–149)
VLDL CHOLESTEROL CAL: 28 mg/dL (ref 5–40)

## 2014-09-07 LAB — VITAMIN D 25 HYDROXY (VIT D DEFICIENCY, FRACTURES): Vit D, 25-Hydroxy: 25.6 ng/mL — ABNORMAL LOW (ref 30.0–100.0)

## 2014-09-07 LAB — TSH: TSH: 1.89 u[IU]/mL (ref 0.450–4.500)

## 2014-09-11 LAB — PAP IG AND HPV HIGH-RISK: HPV, high-risk: NEGATIVE

## 2014-09-18 ENCOUNTER — Ambulatory Visit
Admission: RE | Admit: 2014-09-18 | Discharge: 2014-09-18 | Disposition: A | Discharge status: Home / Self Care | Payer: 59 | Source: Ambulatory Visit | Attending: Obstetrics & Gynecology | Admitting: Obstetrics & Gynecology

## 2014-09-18 DIAGNOSIS — R922 Inconclusive mammogram: Secondary | ICD-10-CM | POA: Insufficient documentation

## 2014-09-18 DIAGNOSIS — Z1231 Encounter for screening mammogram for malignant neoplasm of breast: Secondary | ICD-10-CM | POA: Diagnosis not present

## 2014-09-18 DIAGNOSIS — Z01419 Encounter for gynecological examination (general) (routine) without abnormal findings: Secondary | ICD-10-CM

## 2014-09-21 ENCOUNTER — Other Ambulatory Visit (HOSPITAL_COMMUNITY): Payer: Self-pay | Admitting: Obstetrics & Gynecology

## 2014-09-21 ENCOUNTER — Telehealth: Payer: Self-pay | Admitting: *Deleted

## 2014-09-21 DIAGNOSIS — R7989 Other specified abnormal findings of blood chemistry: Secondary | ICD-10-CM

## 2014-09-21 DIAGNOSIS — R928 Other abnormal and inconclusive findings on diagnostic imaging of breast: Secondary | ICD-10-CM

## 2014-09-21 DIAGNOSIS — N63 Unspecified lump in unspecified breast: Secondary | ICD-10-CM

## 2014-09-21 MED ORDER — VITAMIN D (ERGOCALCIFEROL) 1.25 MG (50000 UNIT) PO CAPS
ORAL_CAPSULE | ORAL | Status: DC
Start: 2014-09-21 — End: 2015-07-11

## 2014-09-21 NOTE — Telephone Encounter (Signed)
Per Dr. Hulan Fray, call in Vitamin D 50,000 units 1 pill weekly x 8 weeks.  I have sent in prescription and patient is aware.

## 2014-09-24 ENCOUNTER — Other Ambulatory Visit: Payer: Self-pay | Admitting: Psychiatry

## 2014-09-26 ENCOUNTER — Telehealth: Payer: Self-pay | Admitting: Certified Nurse Midwife

## 2014-09-26 NOTE — Telephone Encounter (Signed)
Patient wants pap smear results and states she has requested a refill on diflucan

## 2014-09-26 NOTE — Telephone Encounter (Signed)
Patient has appointment tomorrow

## 2014-09-27 ENCOUNTER — Ambulatory Visit
Admission: RE | Admit: 2014-09-27 | Discharge: 2014-09-27 | Disposition: A | Discharge status: Home / Self Care | Payer: 59 | Source: Ambulatory Visit | Attending: Obstetrics & Gynecology | Admitting: Obstetrics & Gynecology

## 2014-09-27 ENCOUNTER — Encounter: Payer: Self-pay | Admitting: *Deleted

## 2014-09-27 ENCOUNTER — Telehealth: Payer: Self-pay | Admitting: *Deleted

## 2014-09-27 ENCOUNTER — Encounter: Payer: Self-pay | Admitting: Psychiatry

## 2014-09-27 ENCOUNTER — Ambulatory Visit (INDEPENDENT_AMBULATORY_CARE_PROVIDER_SITE_OTHER): Payer: 59 | Admitting: Psychiatry

## 2014-09-27 VITALS — BP 124/88 | HR 90 | Temp 98.1°F | Ht 66.0 in | Wt 191.4 lb

## 2014-09-27 DIAGNOSIS — N63 Unspecified lump in unspecified breast: Secondary | ICD-10-CM

## 2014-09-27 DIAGNOSIS — F411 Generalized anxiety disorder: Secondary | ICD-10-CM | POA: Diagnosis not present

## 2014-09-27 DIAGNOSIS — R928 Other abnormal and inconclusive findings on diagnostic imaging of breast: Secondary | ICD-10-CM | POA: Diagnosis present

## 2014-09-27 MED ORDER — CLONAZEPAM 1 MG PO TABS: 1.0000 mg | ORAL_TABLET | Freq: Every day | ORAL | Status: DC

## 2014-09-27 NOTE — Telephone Encounter (Signed)
Patient called and wanted a prescription of Diflucan sent to her pharmacy.   I ask patient her symptoms and she said she didn't have a yeast infection currently she likes to keep on hand so if she gets one.  I told patient she needed to just call when she gets yeast infection and then something could be prescribed at that time.

## 2014-09-27 NOTE — Progress Notes (Signed)
[facility] MD Progress Note  09/27/2014 4:18 PM Meghan [name]  MRN:  [id number] Subjective:  Ongoing general anxiety. Major stresses include the anniversary of the death of her father. Also having a lot of anxiety and stress around the wedding and pregnancy of her stepdaughter. Work is going reasonably okay. No new specific health problems. No sign of abuse of medicine. Sleeps okay. No acute dangerousness Principal Problem: @PPROB @ Diagnosis:   Patient Active Problem List   Diagnosis Date Noted  . Ovarian cyst, left [N83.20] 04/18/2014  . FATIGUE [R53.81, R53.83] 07/29/2009  . SHORTNESS OF BREATH [R06.02] 07/29/2009  . CHEST PAIN [R07.9] 07/29/2009   Total Time spent with patient: 30 minutes   Past Medical History:  Past Medical History  Diagnosis Date  . Fatigue   . SOB (shortness of breath)   . Chest pain   . Depression   . Anxiety   . Polycythemia   . Obesity     Past Surgical History  Procedure Laterality Date  . Appendectomy    . Cholecystectomy    . L salping/oopherectomy      ectopic pregnancy  . Endometrial ablation/tubal ligation     Family History:  Family History  Problem Relation Age of Onset  . Heart attack Father 20    HAS HAD cabg, HAS DEFIBRILLATOR   . Diabetes Father   . Depression Father   . Heart disease Father   . Hyperlipidemia Mother   . Breast cancer Mother 68   Social History:  History  Alcohol Use No     History  Drug Use No    Social History   Social History  . Marital Status: Married    Spouse Name: N/A  . Number of Children: N/A  . Years of Education: N/A   Social History Main Topics  . Smoking status: Current Every Day Smoker -- 1.00 packs/day    Types: Cigarettes    Last Attempt to Quit: 01/04/2010  . Smokeless tobacco: Current User     Comment: QUIT ABOUT 6 MONTHS AGO  . Alcohol Use: No  . Drug Use: No  . Sexual Activity:    Partners: Male    Birth Control/ Protection: Surgical   Other Topics Concern  . None    Social History Narrative   Married, full time, gets regular exercise.    Additional History:    Sleep: Fair  Appetite:  Fair   Assessment:   Musculoskeletal: Strength & Muscle Tone: within normal limits Gait & Station: normal Patient leans: N/A   Psychiatric Specialty Exam: Physical Exam  ROS  Blood pressure 124/88, pulse 90, temperature 98.1 F (36.7 C), temperature source Tympanic, height 5\' 6"  (1.676 m), weight 191 lb 6.4 oz (86.818 kg), last menstrual period 08/13/2014, SpO2 95 %.Body mass index is 30.91 kg/(m^2).  General Appearance: Negative  Eye Contact::  Good  Speech:  Normal Rate  Volume:  Normal  Mood:  Anxious  Affect:  Congruent  Thought Process:  Coherent  Orientation:  Full (Time, Place, and Person)  Thought Content:  Negative  Suicidal Thoughts:  No  Homicidal Thoughts:  No  Memory:  Immediate;   Good Recent;   Good Remote;   Good  Judgement:  Intact  Insight:  Fair  Psychomotor Activity:  Normal  Concentration:  Fair  Recall:  [place] of Knowledge:Good  Language: Good  Akathisia:  No  Handed:  Right  AIMS (if indicated):     Assets:  Communication Skills Desire for  Improvement Financial Resources/Insurance Housing Intimacy Physical Health Resilience Social Support  ADL's:  Intact  Cognition: WNL  Sleep:        Current Medications: Current Outpatient Prescriptions  Medication Sig Dispense Refill  . clonazePAM (KLONOPIN) 1 MG tablet Take 1 tablet (1 mg total) by mouth daily. 34 tablet 5  . ibuprofen (ADVIL,MOTRIN) 800 MG tablet Take 1 tablet (800 mg total) by mouth every 8 (eight) hours as needed. 30 tablet 0  . ondansetron (ZOFRAN ODT) 8 MG disintegrating tablet Take 1 tablet (8 mg total) by mouth every 8 (eight) hours as needed for nausea or vomiting. 20 tablet 0  . Vitamin D, Ergocalciferol, (DRISDOL) 50000 UNITS CAPS capsule Take 1 tablet weekly for 8 weeks 15 capsule 0  . [DISCONTINUED] atomoxetine (STRATTERA) 40 MG  capsule Take 40 mg by mouth daily.       No current facility-administered medications for this visit.    Lab Results: No results found for this or any previous visit (from the past 48 hour(s)).  Physical Findings: AIMS:  , ,  ,  ,    CIWA:    COWS:     Treatment Plan Summary: Medication management and Plan continue clonazepam 1 mg at night. Supportive counseling. Reviewed symptoms. Discussed ways that she can manage stress better. Reviewed medication side effects and usage. I will add a few more clonazepam if possible to the monthly supply for a total of 34 and she occasionally takes 2 of them but not most of the time. Follow-up in 6 months. She can call sooner if needed.   Medical Decision Making:  Established Problem, Stable/Improving (1), Review of Psycho-Social Stressors (1) and Review of Medication Regimen & Side Effects (2)     Meghan [name] 09/27/2014, 4:18 PM

## 2015-02-26 ENCOUNTER — Telehealth: Payer: Self-pay | Admitting: Family Medicine

## 2015-02-26 NOTE — Telephone Encounter (Signed)
Pt is scheduled for 03/04/15. Thanks TNP

## 2015-02-26 NOTE — Telephone Encounter (Signed)
Ok to schedule OV. Thanks.  

## 2015-02-26 NOTE — Telephone Encounter (Signed)
Pt would like to re-establish with our office. I advised I would send a message to Mikki Santee b/c that it who pt saw before. Pt requested a message be sent to Dr. Venia Minks to ask about re-establishing. I advised that Dr. Venia Minks wasn't accepting new pts or re-establishing. Pt stated that Dr. Venia Minks would want to see her and said to send a message. Pt has [employer] and stated no new medications or conditions. Pt stated that she wanted to come in for acid reflux. Please advise. Thanks TNP

## 2015-03-04 ENCOUNTER — Ambulatory Visit (INDEPENDENT_AMBULATORY_CARE_PROVIDER_SITE_OTHER): Payer: BLUE CROSS/BLUE SHIELD | Admitting: Family Medicine

## 2015-03-04 ENCOUNTER — Encounter: Payer: Self-pay | Admitting: Family Medicine

## 2015-03-04 VITALS — BP 128/70 | HR 88 | Temp 97.6°F | Resp 16 | Ht 66.0 in | Wt 199.0 lb

## 2015-03-04 DIAGNOSIS — J42 Unspecified chronic bronchitis: Secondary | ICD-10-CM | POA: Insufficient documentation

## 2015-03-04 DIAGNOSIS — Z720 Tobacco use: Secondary | ICD-10-CM | POA: Diagnosis not present

## 2015-03-04 DIAGNOSIS — F410 Panic disorder [episodic paroxysmal anxiety] without agoraphobia: Secondary | ICD-10-CM | POA: Insufficient documentation

## 2015-03-04 DIAGNOSIS — K219 Gastro-esophageal reflux disease without esophagitis: Secondary | ICD-10-CM | POA: Insufficient documentation

## 2015-03-04 DIAGNOSIS — R1013 Epigastric pain: Secondary | ICD-10-CM | POA: Diagnosis not present

## 2015-03-04 DIAGNOSIS — J309 Allergic rhinitis, unspecified: Secondary | ICD-10-CM | POA: Insufficient documentation

## 2015-03-04 DIAGNOSIS — F988 Other specified behavioral and emotional disorders with onset usually occurring in childhood and adolescence: Secondary | ICD-10-CM | POA: Insufficient documentation

## 2015-03-04 MED ORDER — OMEPRAZOLE 20 MG PO CPDR: 20.0000 mg | DELAYED_RELEASE_CAPSULE | Freq: Two times a day (BID) | ORAL | Status: DC

## 2015-03-04 NOTE — Progress Notes (Signed)
Subjective:    Patient ID: Meghan [name], female    DOB: 05-09-1970, 45 y.o.   MRN: [id number]  Abdominal Pain This is a new problem. The current episode started more than 1 month ago (x 6 months). The problem occurs intermittently ("monthly; sometimes more it's more often than others"). The problem has been rapidly improving. The pain is located in the epigastric region. The pain is at a severity of 7/10. The pain is moderate. Quality: "spasm; giant lump" The abdominal pain radiates to the back (radiates to sternum). Associated symptoms include belching, constipation, flatus (no more than usual) and nausea. Pertinent negatives include no anorexia, diarrhea, dysuria, fever, frequency, headaches, hematochezia, hematuria, melena, vomiting or weight loss. Exacerbated by: certain foods. Treatments tried: Tums, Zantac. The treatment provided no relief.    Review of Systems  Constitutional: Negative for fever and weight loss.  Gastrointestinal: Positive for nausea, abdominal pain, constipation and flatus (no more than usual). Negative for vomiting, diarrhea, melena, hematochezia and anorexia.  Genitourinary: Negative for dysuria, frequency and hematuria.  Neurological: Negative for headaches.   BP 128/70 mmHg  Pulse 88  Temp(Src) 97.6 F (36.4 C) (Oral)  Resp 16  Ht 5\' 6"  (1.676 m)  Wt 199 lb (90.266 kg)  BMI 32.13 kg/m2   Patient Active Problem List   Diagnosis Date Noted  . Allergic rhinitis 03/04/2015  . Attention deficit disorder 03/04/2015  . Chronic bronchitis ([place]) 03/04/2015  . Acid reflux 03/04/2015  . Panic attack 03/04/2015  . Ovarian cyst, left 04/18/2014  . FATIGUE 07/29/2009  . SHORTNESS OF BREATH 07/29/2009  . CHEST PAIN 07/29/2009  . Clinical depression 12/26/2007  . Family history of cardiovascular disease 12/26/2007  . Family history of breast cancer 12/26/2007  . Polycythemia, secondary 12/26/2007   Past Medical History  Diagnosis Date  . Fatigue   . SOB  (shortness of breath)   . Chest pain   . Depression   . Anxiety   . Polycythemia   . Obesity    Current Outpatient Prescriptions on File Prior to Visit  Medication Sig  . clonazePAM (KLONOPIN) 1 MG tablet Take 1 tablet (1 mg total) by mouth daily.  Marland Kitchen ibuprofen (ADVIL,MOTRIN) 800 MG tablet Take 1 tablet (800 mg total) by mouth every 8 (eight) hours as needed.  . ondansetron (ZOFRAN ODT) 8 MG disintegrating tablet Take 1 tablet (8 mg total) by mouth every 8 (eight) hours as needed for nausea or vomiting.  . Vitamin D, Ergocalciferol, (DRISDOL) 50000 UNITS CAPS capsule Take 1 tablet weekly for 8 weeks  . [DISCONTINUED] atomoxetine (STRATTERA) 40 MG capsule Take 40 mg by mouth daily.     No current facility-administered medications on file prior to visit.   Allergies  Allergen Reactions  . Iodine   . Sulfonamide Derivatives     REACTION: face edema   Past Surgical History  Procedure Laterality Date  . Appendectomy    . Cholecystectomy    . L salping/oopherectomy      ectopic pregnancy  . Endometrial ablation/tubal ligation     Social History   Social History  . Marital Status: Married    Spouse Name: N/A  . Number of Children: N/A  . Years of Education: N/A   Occupational History  . Not on file.   Social History Main Topics  . Smoking status: Current Every Day Smoker -- 0.50 packs/day    Types: Cigarettes    Last Attempt to Quit: 01/04/2010  . Smokeless tobacco: Never Used  .  Alcohol Use: Yes     Comment: social  . Drug Use: No  . Sexual Activity:    Partners: Male    Birth Control/ Protection: Surgical   Other Topics Concern  . Not on file   Social History Narrative   Married, full time, gets regular exercise.    Family History  Problem Relation Age of Onset  . Heart attack Father 20    HAS HAD cabg, HAS DEFIBRILLATOR   . Diabetes Father   . Depression Father   . Heart disease Father   . Hyperlipidemia Mother   . Breast cancer Mother 80         Objective:   Physical Exam  Constitutional: She is oriented to person, place, and time. She appears well-developed and well-nourished.  Cardiovascular: Normal rate and regular rhythm.   Pulmonary/Chest: Effort normal and breath sounds normal.  Abdominal: Soft. Bowel sounds are normal.  Neurological: She is alert and oriented to person, place, and time.  Psychiatric: She has a normal mood and affect. Her behavior is normal. Judgment and thought content normal.   BP 128/70 mmHg  Pulse 88  Temp(Src) 97.6 F (36.4 C) (Oral)  Resp 16  Ht 5\' 6"  (1.676 m)  Wt 199 lb (90.266 kg)  BMI 32.13 kg/m2      Assessment & Plan:  1. Tobacco abuse Encourage patient to quit again.    2. Epigastric pain New problem. Worsening.  Will start medication.  Check labs as below. If normal, will consider upper GI or referral.   - CBC with Differential/Platelet - Comprehensive Metabolic Panel (CMET) - H. pylori antigen, stool - omeprazole (PRILOSEC) 20 MG capsule; Take 1 capsule (20 mg total) by mouth 2 (two) times daily before a meal.  Dispense: 60 capsule; Refill: 1   Patient was seen and examined by Jerrell Belfast, MD, and note scribed by Renaldo Fiddler, CMA.  I have reviewed the document for accuracy and completeness and I agree with above. Jerrell Belfast, MD   Margarita Rana, MD

## 2015-03-25 ENCOUNTER — Encounter: Payer: Self-pay | Admitting: Physician Assistant

## 2015-03-25 ENCOUNTER — Ambulatory Visit (INDEPENDENT_AMBULATORY_CARE_PROVIDER_SITE_OTHER): Payer: BLUE CROSS/BLUE SHIELD | Admitting: Physician Assistant

## 2015-03-25 VITALS — BP 118/80 | HR 71 | Temp 98.1°F | Resp 16

## 2015-03-25 DIAGNOSIS — R059 Cough, unspecified: Secondary | ICD-10-CM

## 2015-03-25 DIAGNOSIS — J418 Mixed simple and mucopurulent chronic bronchitis: Secondary | ICD-10-CM

## 2015-03-25 DIAGNOSIS — R05 Cough: Secondary | ICD-10-CM | POA: Diagnosis not present

## 2015-03-25 MED ORDER — HYDROCODONE-HOMATROPINE 5-1.5 MG/5ML PO SYRP
5.0000 mL | ORAL_SOLUTION | Freq: Three times a day (TID) | ORAL | Status: DC | PRN
Start: 2015-03-25 — End: 2015-04-22

## 2015-03-25 MED ORDER — AZITHROMYCIN 250 MG PO TABS: ORAL_TABLET | ORAL | Status: DC

## 2015-03-25 MED ORDER — ALBUTEROL SULFATE HFA 108 (90 BASE) MCG/ACT IN AERS: 1.0000 | INHALATION_SPRAY | Freq: Four times a day (QID) | RESPIRATORY_TRACT | Status: DC | PRN

## 2015-03-25 NOTE — Patient Instructions (Signed)

## 2015-03-25 NOTE — Progress Notes (Signed)
Patient: Meghan [name] Female    DOB: [birth year]   45 y.o.   MRN: [id number] Visit Date: 03/25/2015  Today's Provider: Mar Daring, PA-C   Chief Complaint  Patient presents with  . Cough   Subjective:    Cough This is a new problem. The current episode started yesterday. The cough is non-productive. Associated symptoms include chills, headaches, nasal congestion, shortness of breath and wheezing. Pertinent negatives include no chest pain, ear congestion, ear pain, fever, postnasal drip, rhinorrhea or sore throat. Treatments tried: Ibuprofen. The treatment provided no relief.  She is a current smoker and has h/o chronic bronchitis.    Allergies  Allergen Reactions  . Iodine   . Sulfonamide Derivatives     REACTION: face edema   Previous Medications   CLONAZEPAM (KLONOPIN) 1 MG TABLET    Take 1 tablet (1 mg total) by mouth daily.   IBUPROFEN (ADVIL,MOTRIN) 800 MG TABLET    Take 1 tablet (800 mg total) by mouth every 8 (eight) hours as needed.   OMEPRAZOLE (PRILOSEC) 20 MG CAPSULE    Take 1 capsule (20 mg total) by mouth 2 (two) times daily before a meal.   ONDANSETRON (ZOFRAN ODT) 8 MG DISINTEGRATING TABLET    Take 1 tablet (8 mg total) by mouth every 8 (eight) hours as needed for nausea or vomiting.   VITAMIN D, ERGOCALCIFEROL, (DRISDOL) 50000 UNITS CAPS CAPSULE    Take 1 tablet weekly for 8 weeks    Review of Systems  Constitutional: Positive for chills. Negative for fever.  HENT: Negative for congestion, ear pain, postnasal drip, rhinorrhea and sore throat.   Respiratory: Positive for cough, chest tightness, shortness of breath and wheezing.   Cardiovascular: Negative for chest pain and leg swelling.  Gastrointestinal: Negative for nausea, vomiting and abdominal pain.  Neurological: Positive for headaches. Negative for dizziness.    Social History  Substance Use Topics  . Smoking status: Current Every Day Smoker -- 0.50 packs/day    Types: Cigarettes    Last Attempt to Quit: 01/04/2010  . Smokeless tobacco: Never Used  . Alcohol Use: Yes     Comment: social   Objective:   BP 118/80 mmHg  Pulse 71  Temp(Src) 98.1 F (36.7 C) (Oral)  Resp 16  Wt   SpO2 97%  Physical Exam  Constitutional: She appears well-developed and well-nourished. No distress.  HENT:  Head: Normocephalic and atraumatic.  Right Ear: Hearing, tympanic membrane, external ear and ear canal normal.  Left Ear: Hearing, tympanic membrane, external ear and ear canal normal.  Nose: Mucosal edema and rhinorrhea present. Right sinus exhibits no maxillary sinus tenderness and no frontal sinus tenderness. Left sinus exhibits no maxillary sinus tenderness and no frontal sinus tenderness.  Mouth/Throat: Uvula is midline, oropharynx is clear and moist and mucous membranes are normal. No oropharyngeal exudate, posterior oropharyngeal edema or posterior oropharyngeal erythema.  Eyes: Conjunctivae are normal. Pupils are equal, round, and reactive to light. Right eye exhibits no discharge. Left eye exhibits no discharge. No scleral icterus.  Neck: Normal range of motion. Neck supple. No tracheal deviation present. No thyromegaly present.  Cardiovascular: Normal rate, regular rhythm and normal heart sounds.  Exam reveals no gallop and no friction rub.   No murmur heard. Pulmonary/Chest: Effort normal. No stridor. No respiratory distress. She has no decreased breath sounds. She has wheezes (fine, expiratory wheezes throughout). She has no rhonchi. She has no rales.  Lymphadenopathy:    She  has no cervical adenopathy.  Skin: Skin is warm and dry. She is not diaphoretic.  Vitals reviewed.       Assessment & Plan:     1. Mixed simple and mucopurulent chronic bronchitis (HCC) Worsening symptoms with acute exacerbation.  Will add zpak and albuterol as below.  May take mucinex dm as needed for congestion. She needs to stay well hydrated and get plenty of rest. She may take tylenol or  IBU as needed for fever and body aches. She is to call the office if symptoms fail to improve or worsen. - albuterol (PROVENTIL HFA;VENTOLIN HFA) 108 (90 Base) MCG/ACT inhaler; Inhale 1-2 puffs into the lungs every 6 (six) hours as needed for wheezing or shortness of breath.  Dispense: 1 Inhaler; Refill: 0 - azithromycin (ZITHROMAX) 250 MG tablet; Take 2 tablets PO on day one, and one tablet PO daily thereafter until completed.  Dispense: 6 tablet; Refill: 0  2. Cough Worsening nighttime cough.  Will give hycodan cough syrup as below.  Stay well hydrated. Drowsiness precautions given. Call if symptoms fail to improve or worsen. - HYDROcodone-homatropine (HYCODAN) 5-1.5 MG/5ML syrup; Take 5 mLs by mouth every 8 (eight) hours as needed for cough.  Dispense: 240 mL; Refill: 0       Mar Daring, PA-C  [facility]

## 2015-03-26 ENCOUNTER — Telehealth: Payer: Self-pay

## 2015-03-26 LAB — COMPREHENSIVE METABOLIC PANEL
ALBUMIN: 4 g/dL (ref 3.5–5.5)
ALT: 21 IU/L (ref 0–32)
AST: 15 IU/L (ref 0–40)
Albumin/Globulin Ratio: 0.9 — ABNORMAL LOW (ref 1.2–2.2)
Alkaline Phosphatase: 103 IU/L (ref 39–117)
BILIRUBIN TOTAL: 0.3 mg/dL (ref 0.0–1.2)
BUN / CREAT RATIO: 22 (ref 9–23)
BUN: 17 mg/dL (ref 6–24)
CO2: 25 mmol/L (ref 18–29)
CREATININE: 0.78 mg/dL (ref 0.57–1.00)
Calcium: 9.1 mg/dL (ref 8.7–10.2)
Chloride: 97 mmol/L (ref 96–106)
GFR calc non Af Amer: 92 mL/min/{1.73_m2} (ref >59)
GFR, EST AFRICAN AMERICAN: 106 mL/min/{1.73_m2} (ref >59)
GLUCOSE: 83 mg/dL (ref 65–99)
Globulin, Total: 4.4 g/dL (ref 1.5–4.5)
Potassium: 3.9 mmol/L (ref 3.5–5.2)
Sodium: 136 mmol/L (ref 134–144)
TOTAL PROTEIN: 8.4 g/dL (ref 6.0–8.5)

## 2015-03-26 LAB — CBC WITH DIFFERENTIAL/PLATELET
BASOS ABS: 0 10*3/uL (ref 0.0–0.2)
Basos: 0 %
EOS (ABSOLUTE): 0.1 10*3/uL (ref 0.0–0.4)
EOS: 2 %
HEMATOCRIT: 41.6 % (ref 34.0–46.6)
HEMOGLOBIN: 14.6 g/dL (ref 11.1–15.9)
Immature Grans (Abs): 0 10*3/uL (ref 0.0–0.1)
Immature Granulocytes: 0 %
Lymphocytes Absolute: 1.7 10*3/uL (ref 0.7–3.1)
Lymphs: 29 %
MCH: 29.5 pg (ref 26.6–33.0)
MCHC: 35.1 g/dL (ref 31.5–35.7)
MCV: 84 fL (ref 79–97)
MONOCYTES: 7 %
Monocytes Absolute: 0.4 10*3/uL (ref 0.1–0.9)
NEUTROS ABS: 3.5 10*3/uL (ref 1.4–7.0)
Neutrophils: 62 %
Platelets: 244 10*3/uL (ref 150–379)
RBC: 4.95 x10E6/uL (ref 3.77–5.28)
RDW: 13.1 % (ref 12.3–15.4)
WBC: 5.8 10*3/uL (ref 3.4–10.8)

## 2015-03-26 NOTE — Telephone Encounter (Signed)
-----   Message from Margarita Rana, MD sent at 03/26/2015  8:02 AM EDT ----- Labs stable except stool antigen not back yet. Please see how patient is doing. Thanks.

## 2015-03-26 NOTE — Telephone Encounter (Signed)
LMTCB 03/26/2015  Thanks,   -Mickel Baas

## 2015-03-26 NOTE — Telephone Encounter (Signed)
Patient advised as below. Patient reports she is feeling better. Patient wants to know if she still needs to do her stool studies? sd

## 2015-03-26 NOTE — Telephone Encounter (Signed)
Would do them to complete the work up. Can get better with PPI and be partially treated. Thanks.

## 2015-03-26 NOTE — Telephone Encounter (Signed)
LMTCB Murle Hellstrom Drozdowski, CMA  

## 2015-03-28 ENCOUNTER — Telehealth: Payer: Self-pay | Admitting: Family Medicine

## 2015-03-28 MED ORDER — FLUCONAZOLE 150 MG PO TABS: 150.0000 mg | ORAL_TABLET | Freq: Once | ORAL | Status: DC

## 2015-03-28 NOTE — Telephone Encounter (Signed)
Pt was in earlier this week and was given an antibiotic and has developed a yeast infection.  She would like to get a prescription of difulcan.  [facility] [address].  Thanks Con Memos

## 2015-03-29 NOTE — Telephone Encounter (Signed)
LMTCB

## 2015-04-16 ENCOUNTER — Other Ambulatory Visit: Payer: Self-pay

## 2015-04-16 NOTE — Telephone Encounter (Signed)
pt needs refill on her medication pt does have appt for the  04-22-15 but she is completely out

## 2015-04-22 ENCOUNTER — Encounter: Payer: Self-pay | Admitting: Psychiatry

## 2015-04-22 ENCOUNTER — Ambulatory Visit (INDEPENDENT_AMBULATORY_CARE_PROVIDER_SITE_OTHER): Payer: 59 | Admitting: Psychiatry

## 2015-04-22 VITALS — BP 110/72 | HR 92 | Temp 98.7°F | Ht 66.0 in | Wt 198.6 lb

## 2015-04-22 DIAGNOSIS — F411 Generalized anxiety disorder: Secondary | ICD-10-CM

## 2015-04-22 MED ORDER — CLONAZEPAM 1 MG PO TABS: 1.0000 mg | ORAL_TABLET | Freq: Every day | ORAL | Status: DC

## 2015-04-22 NOTE — Progress Notes (Signed)
BH MD/PA/NP OP Progress Note  04/22/2015 7:25 PM Meghan [name]  MRN:  [id number]  Chief Complaint:  Chief Complaint    Follow-up; Medication Refill     Subjective:  "I guess things are going okay" HPI: Patient seen for follow-up today. 45 year old woman with a history of generalized anxiety. Overall she is functioning well. Temazepam daily helps with her anxiety. She has had a promotion at work and it is stressful but she enjoys it. Home life is going well. No new medical problems. Sleeping well. No evidence of abuse of medicine. Visit Diagnosis:    ICD-9-CM ICD-10-CM   1. Generalized anxiety disorder 300.02 F41.1     Past Psychiatric History: Past history of anxiety and poor tolerance of serotonin reuptake inhibitors. Benefits from low dose of chronic benzodiazepine  Past Medical History:  Past Medical History  Diagnosis Date  . Fatigue   . SOB (shortness of breath)   . Chest pain   . Depression   . Anxiety   . Polycythemia   . Obesity     Past Surgical History  Procedure Laterality Date  . Appendectomy    . Cholecystectomy    . L salping/oopherectomy      ectopic pregnancy  . Endometrial ablation/tubal ligation      Family Psychiatric History: Family history positive for anxiety  Family History:  Family History  Problem Relation Age of Onset  . Heart attack Father 20    HAS HAD cabg, HAS DEFIBRILLATOR   . Diabetes Father   . Depression Father   . Heart disease Father   . Hyperlipidemia Mother   . Breast cancer Mother 53  . Anxiety disorder Sister   . Schizophrenia Brother   . Anxiety disorder Brother   . Anxiety disorder Sister     Social History:  Social History   Social History  . Marital Status: Married    Spouse Name: N/A  . Number of Children: N/A  . Years of Education: N/A   Social History Main Topics  . Smoking status: Current Every Day Smoker -- 0.50 packs/day    Types: Cigarettes    Last Attempt to Quit: 01/04/2010  . Smokeless  tobacco: Never Used  . Alcohol Use: Yes     Comment: social  . Drug Use: No  . Sexual Activity:    Partners: Male    Birth Control/ Protection: Surgical   Other Topics Concern  . None   Social History Narrative   Married, full time, gets regular exercise.     Allergies:  Allergies  Allergen Reactions  . Iodine   . Sulfonamide Derivatives     REACTION: face edema    Metabolic Disorder Labs: No results found for: HGBA1C, MPG No results found for: PROLACTIN Lab Results  Component Value Date   CHOL 181 09/06/2014   TRIG 140 09/06/2014   HDL 36* 09/06/2014   CHOLHDL 5.0* 09/06/2014   LDLCALC 117* 09/06/2014   LDLCALC 112* 04/21/2012     Current Medications: Current Outpatient Prescriptions  Medication Sig Dispense Refill  . albuterol (PROVENTIL HFA;VENTOLIN HFA) 108 (90 Base) MCG/ACT inhaler Inhale 1-2 puffs into the lungs every 6 (six) hours as needed for wheezing or shortness of breath. 1 Inhaler 0  . clonazePAM (KLONOPIN) 1 MG tablet Take 1 tablet (1 mg total) by mouth daily. 30 tablet 5  . ibuprofen (ADVIL,MOTRIN) 800 MG tablet Take 1 tablet (800 mg total) by mouth every 8 (eight) hours as needed. 30 tablet 0  .  omeprazole (PRILOSEC) 20 MG capsule Take 1 capsule (20 mg total) by mouth 2 (two) times daily before a meal. 60 capsule 1  . Vitamin D, Ergocalciferol, (DRISDOL) 50000 UNITS CAPS capsule Take 1 tablet weekly for 8 weeks 15 capsule 0  . [DISCONTINUED] atomoxetine (STRATTERA) 40 MG capsule Take 40 mg by mouth daily.       No current facility-administered medications for this visit.    Neurologic: Headache: No Seizure: No Paresthesias: No  Musculoskeletal: Strength & Muscle Tone: within normal limits Gait & Station: normal Patient leans: N/A  Psychiatric Specialty Exam: ROS  Blood pressure 110/72, pulse 92, temperature 98.7 F (37.1 C), temperature source Tympanic, height 5\' 6"  (1.676 m), weight 198 lb 9.6 oz (90.084 kg), SpO2 96 %.Body mass index is  32.07 kg/(m^2).  General Appearance: Casual  Eye Contact:  Good  Speech:  Clear and Coherent  Volume:  Normal  Mood:  Euthymic  Affect:  Appropriate  Thought Process:  Coherent  Orientation:  Full (Time, Place, and Person)  Thought Content:  Negative  Suicidal Thoughts:  No  Homicidal Thoughts:  No  Memory:  Immediate;   Fair Recent;   Fair Remote;   Fair  Judgement:  Good  Insight:  Good  Psychomotor Activity:  Normal  Concentration:  Good  Recall:  Good  Fund of Knowledge: Good  Language: Good  Akathisia:  No  Handed:  Right  AIMS (if indicated):     Assets:  Communication Skills Desire for Improvement Financial Resources/Insurance Housing Intimacy Physical Health Social Support  ADL's:  Intact  Cognition: WNL  Sleep:  good     Treatment Plan Summary:Medication management and Plan continue clonazepam 1 mg a day when necessary. Supportive counseling done. Patient know she can contact me sooner if needed. Follow-up 6 months.   Alethia Berthold, MD 04/22/2015, 7:25 PM

## 2015-04-26 ENCOUNTER — Other Ambulatory Visit: Payer: Self-pay | Admitting: Psychiatry

## 2015-04-26 MED ORDER — CLONAZEPAM 1 MG PO TABS: 1.0000 mg | ORAL_TABLET | Freq: Every day | ORAL | Status: DC

## 2015-04-26 NOTE — Telephone Encounter (Signed)
Called in a 1 month supply with no refills.

## 2015-05-24 ENCOUNTER — Encounter: Payer: Self-pay | Admitting: Family Medicine

## 2015-05-24 ENCOUNTER — Ambulatory Visit (INDEPENDENT_AMBULATORY_CARE_PROVIDER_SITE_OTHER): Payer: 59 | Admitting: Family Medicine

## 2015-05-24 VITALS — BP 112/70 | HR 78 | Temp 97.6°F | Resp 16 | Wt 199.0 lb

## 2015-05-24 DIAGNOSIS — L03031 Cellulitis of right toe: Secondary | ICD-10-CM | POA: Diagnosis not present

## 2015-05-24 DIAGNOSIS — B351 Tinea unguium: Secondary | ICD-10-CM

## 2015-05-24 MED ORDER — DOXYCYCLINE HYCLATE 100 MG PO TABS: 100.0000 mg | ORAL_TABLET | Freq: Two times a day (BID) | ORAL | Status: DC

## 2015-05-24 MED ORDER — FLUCONAZOLE 150 MG PO TABS: 150.0000 mg | ORAL_TABLET | Freq: Once | ORAL | Status: DC

## 2015-05-24 NOTE — Progress Notes (Signed)
Patient ID: Meghan [name], female   DOB: 09-Jul-1970, 45 y.o.   MRN: [id number]       Patient: Meghan [name] Female    DOB: [birth year]   45 y.o.   MRN: [id number] Visit Date: 05/24/2015  Today's Provider: Margarita Rana, MD   Chief Complaint  Patient presents with  . Toe Pain   Subjective:    HPI Pt is here for toe pain. It is her right big toe. Pain started last night. She has a white line on her toe nail. She remember having this line when she got a pedicure, which was about 3 weeks ago, but it was small and did not hurt at the time. She does not remember hitting or stumping her toe on anything. She has some redness around her toe nail bed. She reports that the whole toe hurts and it hurts to put shoes on.     Did go bowling yesterday for the first time in a long time.   Does have unusual bowling style that puts pressure on her right foot. Does have manicured toenails that are longer than her toe itself.       Allergies  Allergen Reactions  . Iodine   . Sulfonamide Derivatives     REACTION: face edema   Previous Medications   ALBUTEROL (PROVENTIL HFA;VENTOLIN HFA) 108 (90 BASE) MCG/ACT INHALER    Inhale 1-2 puffs into the lungs every 6 (six) hours as needed for wheezing or shortness of breath.   CLONAZEPAM (KLONOPIN) 1 MG TABLET    Take 1 tablet (1 mg total) by mouth daily.   IBUPROFEN (ADVIL,MOTRIN) 800 MG TABLET    Take 1 tablet (800 mg total) by mouth every 8 (eight) hours as needed.   OMEPRAZOLE (PRILOSEC) 20 MG CAPSULE    Take 1 capsule (20 mg total) by mouth 2 (two) times daily before a meal.   VITAMIN D, ERGOCALCIFEROL, (DRISDOL) 50000 UNITS CAPS CAPSULE    Take 1 tablet weekly for 8 weeks    Review of Systems  Constitutional: Negative.   HENT: Negative.   Eyes: Negative.   Respiratory: Negative.   Cardiovascular: Negative.   Gastrointestinal: Negative.   Endocrine: Negative.   Genitourinary: Negative.   Musculoskeletal: Negative.        Toe pain  Skin:  Negative.   Allergic/Immunologic: Negative.   Neurological: Negative.   Hematological: Negative.   Psychiatric/Behavioral: Negative.     Social History  Substance Use Topics  . Smoking status: Current Every Day Smoker -- 0.50 packs/day    Types: Cigarettes    Last Attempt to Quit: 01/04/2010  . Smokeless tobacco: Never Used  . Alcohol Use: Yes     Comment: social   Objective:   BP 112/70 mmHg  Pulse 78  Temp(Src) 97.6 F (36.4 C) (Oral)  Resp 16  Wt 199 lb (90.266 kg)  Physical Exam  Constitutional: She is oriented to person, place, and time. She appears well-developed and well-nourished.  Musculoskeletal: Normal range of motion.  Right great toe with erythema at base, slightly raised, tender to palpation. Toe nail also thick, with large white patch, slightly deformed nail.    Neurological: She is alert and oriented to person, place, and time.      Assessment & Plan:     1. Paronychia of great toe of right foot New problem. Worsening.  Will treat with antibiotics. Patient instructed to call back if condition worsens or does not improve.   ALeve for pain.  -  doxycycline (VIBRA-TABS) 100 MG tablet; Take 1 tablet (100 mg total) by mouth 2 (two) times daily.  Dispense: 14 tablet; Refill: 0  2. Onychomycosis New problem.  Will check labs and call in rx for Lamisil if labs are normal.  ALso requested Fluconazole today so will not get yeast infection from antibiotic.  - Comprehensive metabolic panel - fluconazole (DIFLUCAN) 150 MG tablet; Take 1 tablet (150 mg total) by mouth once.  Dispense: 1 tablet; Refill: 0     Patient was seen and examined by Dr. Margarita Rana, and noted scribed by Webb Laws, [place].  I have reviewed the document for accuracy and completeness and I agree with above. - Jerrell Belfast, MD   Margarita Rana, MD  [facility]

## 2015-05-25 LAB — COMPREHENSIVE METABOLIC PANEL
ALT: 20 IU/L (ref 0–32)
AST: 13 IU/L (ref 0–40)
Albumin/Globulin Ratio: 0.9 — ABNORMAL LOW (ref 1.2–2.2)
Albumin: 4.4 g/dL (ref 3.5–5.5)
Alkaline Phosphatase: 95 IU/L (ref 39–117)
BILIRUBIN TOTAL: 0.4 mg/dL (ref 0.0–1.2)
BUN/Creatinine Ratio: 18 (ref 9–23)
BUN: 13 mg/dL (ref 6–24)
CHLORIDE: 98 mmol/L (ref 96–106)
CO2: 25 mmol/L (ref 18–29)
Calcium: 9.4 mg/dL (ref 8.7–10.2)
Creatinine, Ser: 0.73 mg/dL (ref 0.57–1.00)
GFR calc non Af Amer: 100 mL/min/{1.73_m2} (ref >59)
GFR, EST AFRICAN AMERICAN: 115 mL/min/{1.73_m2} (ref >59)
GLUCOSE: 84 mg/dL (ref 65–99)
Globulin, Total: 4.8 g/dL — ABNORMAL HIGH (ref 1.5–4.5)
Potassium: 4.1 mmol/L (ref 3.5–5.2)
Sodium: 137 mmol/L (ref 134–144)
TOTAL PROTEIN: 9.2 g/dL — AB (ref 6.0–8.5)

## 2015-05-27 ENCOUNTER — Telehealth: Payer: Self-pay

## 2015-05-27 DIAGNOSIS — L03031 Cellulitis of right toe: Secondary | ICD-10-CM

## 2015-05-27 DIAGNOSIS — B351 Tinea unguium: Secondary | ICD-10-CM

## 2015-05-27 MED ORDER — TERBINAFINE HCL 250 MG PO TABS: 250.0000 mg | ORAL_TABLET | Freq: Every day | ORAL | Status: DC

## 2015-05-27 NOTE — Telephone Encounter (Signed)
-----   Message from Margarita Rana, MD sent at 05/26/2015  1:20 PM EDT ----- Labs stable. Please notify patient. Thanks.

## 2015-05-27 NOTE — Telephone Encounter (Signed)
Advised patient as below. Patient is wanting to do the podiatry referral ASAP. Do you think she can get in this week? Please advise. Order has been placed.  Thanks!

## 2015-05-27 NOTE — Telephone Encounter (Signed)
Advised patient as below. Patient reports that since her labs were normal, you were going to send in another medication as well? [facility] [address]   Patient also wanted to let you know that she feels as if her big toe has a "pulse" in it. She reports that she has been taking Aleve and her abx (Doxy), but her toe pain is keeping her up at night. Patient is wanting to know what can she do to resolve this issue? Please advise. Thanks!

## 2015-05-27 NOTE — Telephone Encounter (Signed)
Left message to call back  

## 2015-05-27 NOTE — Telephone Encounter (Signed)
Going to take time. Can refer to podiatry and they may be able to remove nail to remove pressure sensation and that will really help. Thanks.

## 2015-07-10 ENCOUNTER — Encounter: Payer: Self-pay | Admitting: Physician Assistant

## 2015-07-10 ENCOUNTER — Ambulatory Visit (INDEPENDENT_AMBULATORY_CARE_PROVIDER_SITE_OTHER): Payer: 59 | Admitting: Physician Assistant

## 2015-07-10 VITALS — BP 108/80 | HR 88 | Temp 97.8°F | Resp 16 | Wt 204.2 lb

## 2015-07-10 DIAGNOSIS — D751 Secondary polycythemia: Secondary | ICD-10-CM | POA: Diagnosis not present

## 2015-07-10 DIAGNOSIS — Z720 Tobacco use: Secondary | ICD-10-CM | POA: Diagnosis not present

## 2015-07-10 DIAGNOSIS — Z8249 Family history of ischemic heart disease and other diseases of the circulatory system: Secondary | ICD-10-CM | POA: Diagnosis not present

## 2015-07-10 DIAGNOSIS — R002 Palpitations: Secondary | ICD-10-CM

## 2015-07-10 DIAGNOSIS — R7989 Other specified abnormal findings of blood chemistry: Secondary | ICD-10-CM

## 2015-07-10 DIAGNOSIS — Z716 Tobacco abuse counseling: Secondary | ICD-10-CM

## 2015-07-10 NOTE — Patient Instructions (Signed)

## 2015-07-10 NOTE — Progress Notes (Signed)
Patient: Meghan [name] Female    DOB: January 04, 1971   45 y.o.   MRN: [id number] Visit Date: 07/10/2015  Today's Provider: Mar Daring, PA-C   Chief Complaint  Patient presents with  . Palpitations   Subjective:    Palpitations  This is a new problem. The current episode started 1 to 4 weeks ago (3-4 weeks ago). The problem occurs constantly. The problem has been gradually worsening. Episode Length: Periodically through out the day. Nothing aggravates the symptoms. Associated symptoms include dizziness (she sitting in her car driving last week and she had one of those palpitations and she got dizzy "felt like when you  just stand up fast and feel dizzy" but she was sitting and driving.) and malaise/fatigue (sometimes). Pertinent negatives include no anxiety (She does take Klonopin every day), chest fullness, chest pain, coughing, fever, irregular heartbeat, nausea (with the epidose of last week), numbness, shortness of breath, syncope or weakness. Associated symptoms comments: dizziness. She has tried nothing for the symptoms.  She reports that she has a appointment scheduled next month with the Cardiologist, Dr. Wende Bushy on 08/08/15. She also has a strong family history on her father's side with heart disease. Her father had multiple MIs with the earliest when he was in his 65s. He had 2 quadruple bypasses. He also developed atrial fibrillation, heart failure and had an ICD implanted. He was scheduled to have an ablation just 2 days following when he passed away. Paternal grandfather also passed from an MI in his 75s. Patient states father's cholesterol was never well controlled and his triglycerides would run in the 800s. Seemingly a familial hyperlipidemia. Her most recent cholesterol in 08/2015 was fairly normal with exception of borderline elevated LDL.  *Of note she also had went almost one full year without a menstrual cycle and over the last 2 months she has had a regular  menstrual cycle that was similar to her previous menstrual cycles, no irregularities. Previous Meade in 09/06/2014 was 55.0, 3 yrs before that it had been 13.0.    Allergies  Allergen Reactions  . Iodine   . Sulfonamide Derivatives     REACTION: face edema   Current Meds  Medication Sig  . clonazePAM (KLONOPIN) 1 MG tablet Take 1 tablet (1 mg total) by mouth daily.  Marland Kitchen ibuprofen (ADVIL,MOTRIN) 800 MG tablet Take 1 tablet (800 mg total) by mouth every 8 (eight) hours as needed.  Marland Kitchen omeprazole (PRILOSEC) 20 MG capsule Take 1 capsule (20 mg total) by mouth 2 (two) times daily before a meal.    Review of Systems  Constitutional: Positive for malaise/fatigue (sometimes) and fatigue. Negative for fever and chills.  Respiratory: Negative for cough, chest tightness and shortness of breath.   Cardiovascular: Positive for palpitations. Negative for chest pain, leg swelling and syncope.  Gastrointestinal: Negative for nausea (with the epidose of last week) and abdominal pain.  Neurological: Positive for dizziness (she sitting in her car driving last week and she had one of those palpitations and she got dizzy "felt like when you  just stand up fast and feel dizzy" but she was sitting and driving.). Negative for weakness, light-headedness, numbness and headaches.  Psychiatric/Behavioral: The patient is not nervous/anxious (She does take Klonopin every day).        H/o GAD well controlled on klonopin.    Social History  Substance Use Topics  . Smoking status: Current Every Day Smoker -- 0.50 packs/day    Types:  Cigarettes    Last Attempt to Quit: 01/04/2010  . Smokeless tobacco: Never Used  . Alcohol Use: Yes     Comment: social   Objective:   BP 108/80 mmHg  Pulse 88  Temp(Src) 97.8 F (36.6 C) (Oral)  Resp 16  Wt 204 lb 3.2 oz (92.625 kg)  Physical Exam  Constitutional: She appears well-developed and well-nourished. No distress.  Neck: Normal range of motion. Neck supple. No JVD present.  No thyromegaly present.  Cardiovascular: Normal rate, regular rhythm and normal heart sounds.  Exam reveals no gallop and no friction rub.   No murmur heard. Pulmonary/Chest: Effort normal and breath sounds normal. No respiratory distress. She has no wheezes. She has no rales.  Musculoskeletal: She exhibits no edema.  Skin: She is not diaphoretic.  Vitals reviewed.     Assessment & Plan:     1. Heart palpitations EKG showed NSR. Will check labs as below to r/o other cause. Strong family history of CAD. Will also call to see if we can get an earlier appt that 08/08/15 for further evaluation. She is to call if symptoms continue or worsen. - EKG 12-Lead - CBC with Differential - Basic Metabolic Panel (BMET) - TSH - Vitamin D (25 hydroxy) - B12  2. Encounter for smoking cessation counseling Plans to quit smoking this weekend.  3. Polycythemia, secondary Secondary to smoking. Will check labs and f/u pending lab results. - CBC with Differential  4. Family history of cardiovascular disease Strong history in father and paternal grandfather. Also some CAD on Mother's side in two maternal aunts.      Mar Daring, PA-C  [facility]

## 2015-07-11 ENCOUNTER — Telehealth: Payer: Self-pay

## 2015-07-11 LAB — CBC WITH DIFFERENTIAL/PLATELET
Basophils Absolute: 0 10*3/uL (ref 0.0–0.2)
Basos: 0 %
EOS (ABSOLUTE): 0.1 10*3/uL (ref 0.0–0.4)
EOS: 2 %
HEMATOCRIT: 40.8 % (ref 34.0–46.6)
HEMOGLOBIN: 14.3 g/dL (ref 11.1–15.9)
Immature Grans (Abs): 0 10*3/uL (ref 0.0–0.1)
Immature Granulocytes: 0 %
LYMPHS ABS: 2.5 10*3/uL (ref 0.7–3.1)
Lymphs: 36 %
MCH: 29.2 pg (ref 26.6–33.0)
MCHC: 35 g/dL (ref 31.5–35.7)
MCV: 83 fL (ref 79–97)
MONOCYTES: 9 %
Monocytes Absolute: 0.6 10*3/uL (ref 0.1–0.9)
Neutrophils Absolute: 3.6 10*3/uL (ref 1.4–7.0)
Neutrophils: 53 %
Platelets: 254 10*3/uL (ref 150–379)
RBC: 4.89 x10E6/uL (ref 3.77–5.28)
RDW: 13.4 % (ref 12.3–15.4)
WBC: 6.9 10*3/uL (ref 3.4–10.8)

## 2015-07-11 LAB — BASIC METABOLIC PANEL
BUN/Creatinine Ratio: 20 (ref 9–23)
BUN: 16 mg/dL (ref 6–24)
CALCIUM: 9.2 mg/dL (ref 8.7–10.2)
CO2: 22 mmol/L (ref 18–29)
CREATININE: 0.8 mg/dL (ref 0.57–1.00)
Chloride: 99 mmol/L (ref 96–106)
GFR calc Af Amer: 103 mL/min/{1.73_m2} (ref >59)
GFR, EST NON AFRICAN AMERICAN: 89 mL/min/{1.73_m2} (ref >59)
Glucose: 82 mg/dL (ref 65–99)
Potassium: 4.4 mmol/L (ref 3.5–5.2)
SODIUM: 136 mmol/L (ref 134–144)

## 2015-07-11 LAB — VITAMIN D 25 HYDROXY (VIT D DEFICIENCY, FRACTURES): VIT D 25 HYDROXY: 21.1 ng/mL — AB (ref 30.0–100.0)

## 2015-07-11 LAB — TSH: TSH: 2.14 u[IU]/mL (ref 0.450–4.500)

## 2015-07-11 LAB — VITAMIN B12: VITAMIN B 12: 353 pg/mL (ref 211–946)

## 2015-07-11 MED ORDER — VITAMIN D (ERGOCALCIFEROL) 1.25 MG (50000 UNIT) PO CAPS: 50000.0000 [IU] | ORAL_CAPSULE | ORAL | Status: DC

## 2015-07-11 NOTE — Telephone Encounter (Signed)
-----   Message from Mar Daring, [place] sent at 07/11/2015  8:38 AM EDT ----- All labs are within normal limits and stable but Vit D is quite low again. Will send in Rx for Vit D to be taken once weekly again.   Thanks! -JB

## 2015-07-11 NOTE — Telephone Encounter (Signed)
LMTCB  Thanks,  -Cyerra Yim 

## 2015-07-11 NOTE — Addendum Note (Signed)
Addended by: Mar Daring on: 07/11/2015 08:40 AM   Modules accepted: Orders

## 2015-07-13 NOTE — Telephone Encounter (Signed)
Left message 07/13/15  Thanks,  -Corion Sherrod

## 2015-07-17 NOTE — Telephone Encounter (Signed)
Patient advised as below.  

## 2015-07-17 NOTE — Telephone Encounter (Signed)
LMTCB. sd  

## 2015-08-07 ENCOUNTER — Ambulatory Visit: Payer: 59 | Admitting: Cardiology

## 2015-09-23 ENCOUNTER — Ambulatory Visit: Payer: 59 | Admitting: Obstetrics & Gynecology

## 2015-10-08 ENCOUNTER — Encounter: Payer: Self-pay | Admitting: Obstetrics & Gynecology

## 2015-10-08 ENCOUNTER — Ambulatory Visit (INDEPENDENT_AMBULATORY_CARE_PROVIDER_SITE_OTHER): Payer: 59 | Admitting: Obstetrics & Gynecology

## 2015-10-08 VITALS — BP 113/79 | HR 85 | Wt 205.0 lb

## 2015-10-08 DIAGNOSIS — Z01419 Encounter for gynecological examination (general) (routine) without abnormal findings: Secondary | ICD-10-CM | POA: Diagnosis not present

## 2015-10-08 MED ORDER — SCOPOLAMINE 1 MG/3DAYS TD PT72: 1.0000 | MEDICATED_PATCH | TRANSDERMAL | 12 refills | Status: DC

## 2015-10-08 MED ORDER — VARENICLINE TARTRATE 1 MG PO TABS: 1.0000 mg | ORAL_TABLET | Freq: Two times a day (BID) | ORAL | 3 refills | Status: DC

## 2015-10-08 MED ORDER — FLUCONAZOLE 150 MG PO TABS: 150.0000 mg | ORAL_TABLET | Freq: Once | ORAL | 3 refills | Status: AC

## 2015-10-08 NOTE — Progress Notes (Signed)
Subjective:    Meghan [name] is a 45 y.o. MW P2 female who presents for an annual exam. The patient has no complaints today. The patient is sexually active. GYN screening history: last pap: was normal. The patient wears seatbelts: yes. The patient participates in regular exercise: yes. Has the patient ever been transfused or tattooed?: no. The patient reports that there is not domestic violence in her life.   Menstrual History: OB History    Gravida Para Term Preterm AB Living   2 2           SAB TAB Ectopic Multiple Live Births                  Menarche age: 38  No LMP recorded. Patient has had an ablation.    The following portions of the patient's history were reviewed and updated as appropriate: allergies, current medications, past family history, past medical history, past social history, past surgical history and problem list.  Review of Systems Pertinent items are noted in HPI.  She had a BTL. Married and monogamous for 20 years. Works for [employer]. Declines flu vaccine. Mammogram due now. She declines a flu vaccine.   Objective:    BP 113/79   Pulse 85   Wt 205 lb (93 kg)   BMI 33.09 kg/m   General Appearance:    Alert, cooperative, no distress, appears stated age  Head:    Normocephalic, without obvious abnormality, atraumatic  Eyes:    PERRL, conjunctiva/corneas clear, EOM's intact, fundi    benign, both eyes  Ears:    Normal TM's and external ear canals, both ears  Nose:   Nares normal, septum midline, mucosa normal, no drainage    or sinus tenderness  Throat:   Lips, mucosa, and tongue normal; teeth and gums normal  Neck:   Supple, symmetrical, trachea midline, no adenopathy;    thyroid:  no enlargement/tenderness/nodules; no carotid   bruit or JVD  Back:     Symmetric, no curvature, ROM normal, no CVA tenderness  Lungs:     Clear to auscultation bilaterally, respirations unlabored  Chest Wall:    No tenderness or deformity   Heart:    Regular rate and  rhythm, S1 and S2 normal, no murmur, rub   or gallop  Breast Exam:    No tenderness, masses, or nipple abnormality  Abdomen:     Soft, non-tender, bowel sounds active all four quadrants,    no masses, no organomegaly  Genitalia:    Normal female without lesion, discharge or tenderness, NSSA, NT, mobile, no palpable adnexal masses     Extremities:   Extremities normal, atraumatic, no cyanosis or edema  Pulses:   2+ and symmetric all extremities  Skin:   Skin color, texture, turgor normal, no rashes or lesions  Lymph nodes:   Cervical, supraclavicular, and axillary nodes normal  Neurologic:   CNII-XII intact, normal strength, sensation and reflexes    throughout   .    Assessment:    Healthy female exam.   Desire for smoking cessation   Plan:     Mammogram.   Start Chantix when she returns from her cruise Scopolomine prn seasickness RTC 2 months/prn sooner

## 2015-10-11 ENCOUNTER — Ambulatory Visit: Payer: Self-pay | Admitting: Physician Assistant

## 2015-11-12 ENCOUNTER — Encounter: Payer: Self-pay | Admitting: Family Medicine

## 2015-11-12 ENCOUNTER — Ambulatory Visit (INDEPENDENT_AMBULATORY_CARE_PROVIDER_SITE_OTHER): Payer: 59 | Admitting: Family Medicine

## 2015-11-12 VITALS — BP 112/80 | HR 80 | Temp 97.7°F | Resp 16 | Wt 211.0 lb

## 2015-11-12 DIAGNOSIS — T50905A Adverse effect of unspecified drugs, medicaments and biological substances, initial encounter: Secondary | ICD-10-CM

## 2015-11-12 DIAGNOSIS — T887XXA Unspecified adverse effect of drug or medicament, initial encounter: Secondary | ICD-10-CM

## 2015-11-12 NOTE — Progress Notes (Signed)
Subjective:     Patient ID: Meghan [name], female   DOB: [birth year], 45 y.o.   MRN: [id number]  HPI  Chief Complaint  Patient presents with  . Dizziness    Fatigue and nausea. Just came home from cruise on Saturday, 11/09/2015. Was wearing scopolamine patches for motion sickness, but reports she has noticed the sx since removing patches. Denies chest pain/SOB. Pt states it feels like vertigo.  Reports dizziness dry mouth, fatigue, and subsequent mild nausea. Used the patches for approximately 9 days. Reviewed side effects and withdrawal sx to scopolamine with her.   Review of Systems     Objective:   Physical Exam  Constitutional: She appears well-developed and well-nourished. No distress.  HENT:  Right Ear: Tympanic membrane normal.  Left Ear: Tympanic membrane normal.  Eyes: EOM are normal. Pupils are equal, round, and reactive to light.  Cardiovascular: Normal rate and regular rhythm.   Pulmonary/Chest: Breath sounds normal.  Neurological: Coordination ( mild unsteadiness when walking) normal.       Assessment:    1. Adverse effect of drug, initial encounter: to scopolamine     Plan:    Work excuse for 11/1. Has Zofran at home if needed.

## 2015-11-12 NOTE — Patient Instructions (Signed)
Stay out of work tomorrow and let me know in the next day or two how you are doing.

## 2015-11-13 ENCOUNTER — Telehealth: Payer: Self-pay | Admitting: Family Medicine

## 2015-11-13 NOTE — Telephone Encounter (Signed)
Pt was in yesterday, seen Mikki Santee and was to report today how she was feeling.  Pt states she is feeling a little better today.  She said she slept about 14 hrs yesterday.  If you need to call her back the number is  [phone].  Thanks, Con Memos

## 2015-11-14 ENCOUNTER — Ambulatory Visit: Payer: 59

## 2015-11-14 NOTE — Telephone Encounter (Signed)
lmtcb Emily Drozdowski, CMA  

## 2015-11-14 NOTE — Telephone Encounter (Signed)
Does she need another day off?

## 2015-11-15 NOTE — Telephone Encounter (Signed)
Patient reports that she does not need another day off, she returned back to work Thursday and works from home on Friday.Patient states that everyday she is feeling slightly better. KW

## 2015-11-21 ENCOUNTER — Telehealth: Payer: Self-pay

## 2015-11-21 NOTE — Telephone Encounter (Signed)
Medication management - Telephone message left for patient this nurse received her message requesting a refill of her Clonazepam but not scheduled until 12/09/15.  Informed would send request to Dr. Weber Cooks who should be in the office this date.  Patient last evaluated and received a one time order 04/26/15 with plan to return in 6 months and to use Clonazepam as needed.  Informed patient she would be contacted back once Dr. Weber Cooks responds.

## 2015-11-22 ENCOUNTER — Other Ambulatory Visit: Payer: Self-pay | Admitting: Psychiatry

## 2015-11-22 MED ORDER — CLONAZEPAM 1 MG PO TABS: 1.0000 mg | ORAL_TABLET | Freq: Every day | ORAL | 0 refills | Status: DC

## 2015-11-22 NOTE — Telephone Encounter (Signed)
I have phoned in a 1 month refill for this today.

## 2015-11-26 ENCOUNTER — Telehealth: Payer: Self-pay | Admitting: *Deleted

## 2015-12-06 ENCOUNTER — Ambulatory Visit (INDEPENDENT_AMBULATORY_CARE_PROVIDER_SITE_OTHER): Payer: 59 | Admitting: Family Medicine

## 2015-12-06 ENCOUNTER — Encounter: Payer: Self-pay | Admitting: Family Medicine

## 2015-12-06 VITALS — BP 106/72 | HR 84 | Temp 97.9°F | Resp 16 | Wt 208.0 lb

## 2015-12-06 DIAGNOSIS — K219 Gastro-esophageal reflux disease without esophagitis: Secondary | ICD-10-CM

## 2015-12-06 DIAGNOSIS — R05 Cough: Secondary | ICD-10-CM

## 2015-12-06 DIAGNOSIS — R059 Cough, unspecified: Secondary | ICD-10-CM

## 2015-12-06 MED ORDER — SUCRALFATE 1 G PO TABS: 1.0000 g | ORAL_TABLET | Freq: Three times a day (TID) | ORAL | 0 refills | Status: DC

## 2015-12-06 NOTE — Patient Instructions (Signed)
Increase prilosec to one pill twice daily. Elevate the legs at the head of your bed to 6 inches. May use Tums as needed.

## 2015-12-06 NOTE — Progress Notes (Signed)
Subjective:     Patient ID: Meghan [name], female   DOB: June 28, 1970, 45 y.o.   MRN: [id number]  HPI  Chief Complaint  Patient presents with  . Cough    Pt contacted [facility] around 11/02/2015, who diagnosed pt with bronchitis and prescribed an inhaler and steroids. Pt already had inhaler at home, and does not like to take steroids. Pt contacted the website again about 2 weeks ago. Pt was then prescribed a [employer] and [employer], without relief. Pt was advised to get CXR if sx did not improve. Cough is dry. Pt also notes fatigue (sleeping 11-12 hours per day) and "red palms".  States she no longer feels sick but has persistent dry cough, throat clearing, and esophageal spasms. Reports Tums use several x week. States cough is worse at night and can wake her up. Denies sinus congestion.   Review of Systems     Objective:   Physical Exam  Constitutional: She appears well-developed and well-nourished. No distress.  Pulmonary/Chest: Breath sounds normal.       Assessment:    1. Cough - DG Chest 2 View; Future  2. Gastroesophageal reflux disease without esophagitis - sucralfate (CARAFATE) 1 g tablet; Take 1 tablet (1 g total) by mouth 4 (four) times daily -  with meals and at bedtime.  Dispense: 28 tablet; Refill: 0    Plan:    Resume twice daily omeprazole. Discussed HOB elevation.

## 2015-12-09 ENCOUNTER — Ambulatory Visit (INDEPENDENT_AMBULATORY_CARE_PROVIDER_SITE_OTHER): Payer: 59 | Admitting: Psychiatry

## 2015-12-09 ENCOUNTER — Ambulatory Visit
Admission: RE | Admit: 2015-12-09 | Discharge: 2015-12-09 | Disposition: A | Discharge status: Home / Self Care | Payer: 59 | Source: Ambulatory Visit | Attending: Family Medicine | Admitting: Family Medicine

## 2015-12-09 ENCOUNTER — Telehealth: Payer: Self-pay

## 2015-12-09 DIAGNOSIS — F411 Generalized anxiety disorder: Secondary | ICD-10-CM

## 2015-12-09 DIAGNOSIS — R05 Cough: Secondary | ICD-10-CM

## 2015-12-09 DIAGNOSIS — R059 Cough, unspecified: Secondary | ICD-10-CM

## 2015-12-09 MED ORDER — CLONAZEPAM 1 MG PO TABS: 1.0000 mg | ORAL_TABLET | Freq: Every day | ORAL | 5 refills | Status: DC

## 2015-12-09 NOTE — Telephone Encounter (Signed)
-----   Message from Carmon Ginsberg, [place] sent at 12/09/2015  4:07 PM EST ----- Chest x-ray is clear

## 2015-12-09 NOTE — Progress Notes (Signed)
Follow-up for this 45 year old woman with chronic anxiety. No new complaints. She's had a few medical problems as last 6 months but is recovering well. She says she is making an effort to try to be more assertive with her family and not let them push her around as much. Sleep is adequate. Appetite normal. No depression. No suicidal ideation no sign of psychosis. No indication of overuse or abuse of medicine.  Supportive counseling and review of plan. Continue low dose chronic clonazepam. Follow-up in another 6 months.

## 2015-12-09 NOTE — Telephone Encounter (Signed)
Pt advised. Pt states the Carafate improved sx significantly. Renaldo Fiddler, CMA

## 2015-12-16 ENCOUNTER — Ambulatory Visit
Admission: RE | Admit: 2015-12-16 | Discharge: 2015-12-16 | Disposition: A | Discharge status: Home / Self Care | Payer: 59 | Source: Ambulatory Visit | Attending: Obstetrics & Gynecology | Admitting: Obstetrics & Gynecology

## 2015-12-16 DIAGNOSIS — Z1231 Encounter for screening mammogram for malignant neoplasm of breast: Secondary | ICD-10-CM | POA: Insufficient documentation

## 2015-12-16 DIAGNOSIS — Z01419 Encounter for gynecological examination (general) (routine) without abnormal findings: Secondary | ICD-10-CM

## 2016-01-01 ENCOUNTER — Encounter: Payer: Self-pay | Admitting: Physician Assistant

## 2016-01-01 ENCOUNTER — Ambulatory Visit (INDEPENDENT_AMBULATORY_CARE_PROVIDER_SITE_OTHER): Payer: 59 | Admitting: Physician Assistant

## 2016-01-01 VITALS — BP 120/78 | HR 96 | Temp 97.4°F | Resp 16 | Wt 207.8 lb

## 2016-01-01 DIAGNOSIS — T3695XA Adverse effect of unspecified systemic antibiotic, initial encounter: Secondary | ICD-10-CM | POA: Diagnosis not present

## 2016-01-01 DIAGNOSIS — R52 Pain, unspecified: Secondary | ICD-10-CM | POA: Diagnosis not present

## 2016-01-01 DIAGNOSIS — B379 Candidiasis, unspecified: Secondary | ICD-10-CM

## 2016-01-01 DIAGNOSIS — R05 Cough: Secondary | ICD-10-CM | POA: Diagnosis not present

## 2016-01-01 DIAGNOSIS — R059 Cough, unspecified: Secondary | ICD-10-CM

## 2016-01-01 DIAGNOSIS — J0100 Acute maxillary sinusitis, unspecified: Secondary | ICD-10-CM | POA: Diagnosis not present

## 2016-01-01 LAB — POCT INFLUENZA A/B
Influenza A, POC: NEGATIVE
Influenza B, POC: NEGATIVE

## 2016-01-01 MED ORDER — FLUCONAZOLE 150 MG PO TABS: 150.0000 mg | ORAL_TABLET | Freq: Once | ORAL | 0 refills | Status: AC

## 2016-01-01 MED ORDER — AMOXICILLIN-POT CLAVULANATE 875-125 MG PO TABS: 1.0000 | ORAL_TABLET | Freq: Two times a day (BID) | ORAL | 0 refills | Status: DC

## 2016-01-01 MED ORDER — HYDROCODONE-HOMATROPINE 5-1.5 MG/5ML PO SYRP
5.0000 mL | ORAL_SOLUTION | Freq: Three times a day (TID) | ORAL | 0 refills | Status: DC | PRN
Start: 2016-01-01 — End: 2016-05-20

## 2016-01-01 NOTE — Patient Instructions (Signed)

## 2016-01-01 NOTE — Progress Notes (Signed)
Patient: Meghan [name] Female    DOB: 1970-12-08   45 y.o.   MRN: [id number] Visit Date: 01/01/2016  Today's Provider: Mar Daring, PA-C   Chief Complaint  Patient presents with  . URI   Subjective:    URI   This is a new problem. The current episode started in the past 7 days (Sunday night, worsen last night). The problem has been gradually worsening. There has been no fever. Associated symptoms include congestion, coughing, diarrhea (this morning), headaches, rhinorrhea, sinus pain and sneezing. Pertinent negatives include no abdominal pain, chest pain, ear pain, sore throat, vomiting or wheezing.      Allergies  Allergen Reactions  . Iodine   . Sulfonamide Derivatives     REACTION: face edema     Current Outpatient Prescriptions:  .  clonazePAM (KLONOPIN) 1 MG tablet, Take 1 tablet (1 mg total) by mouth daily., Disp: 30 tablet, Rfl: 5 .  ibuprofen (ADVIL,MOTRIN) 800 MG tablet, Take 1 tablet (800 mg total) by mouth every 8 (eight) hours as needed., Disp: 30 tablet, Rfl: 0 .  omeprazole (PRILOSEC) 20 MG capsule, Take 1 capsule (20 mg total) by mouth 2 (two) times daily before a meal., Disp: 60 capsule, Rfl: 1 .  sucralfate (CARAFATE) 1 g tablet, Take 1 tablet (1 g total) by mouth 4 (four) times daily -  with meals and at bedtime., Disp: 28 tablet, Rfl: 0 .  Vitamin D, Ergocalciferol, (DRISDOL) 50000 units CAPS capsule, Take 1 capsule (50,000 Units total) by mouth every 7 (seven) days., Disp: 15 capsule, Rfl: 0  Review of Systems  Constitutional: Positive for chills and fatigue. Negative for fever.  HENT: Positive for congestion, postnasal drip, rhinorrhea, sinus pain and sneezing. Negative for ear pain, sinus pressure and sore throat.   Respiratory: Positive for cough and chest tightness. Negative for shortness of breath and wheezing.   Cardiovascular: Negative for chest pain.  Gastrointestinal: Positive for diarrhea (this morning). Negative for abdominal  pain and vomiting.  Skin:       She feels like her skin hurts "like about to break into something"  Neurological: Positive for headaches. Negative for dizziness.    Social History  Substance Use Topics  . Smoking status: Current Every Day Smoker    Packs/day: 0.50    Types: Cigarettes    Last attempt to quit: 01/04/2010  . Smokeless tobacco: Never Used  . Alcohol use Yes     Comment: social   Objective:   BP 120/78 (BP Location: Right Arm, Patient Position: Sitting, Cuff Size: Normal)   Pulse 96   Temp 97.4 F (36.3 C) (Oral)   Resp 16   Wt 207 lb 12.8 oz (94.3 kg)   SpO2 97%   BMI 33.54 kg/m   Physical Exam  Constitutional: She appears well-developed and well-nourished. No distress.  HENT:  Head: Normocephalic and atraumatic.  Right Ear: Hearing, tympanic membrane, external ear and ear canal normal.  Left Ear: Hearing, tympanic membrane, external ear and ear canal normal.  Nose: Mucosal edema and rhinorrhea present. Right sinus exhibits maxillary sinus tenderness. Right sinus exhibits no frontal sinus tenderness. Left sinus exhibits maxillary sinus tenderness. Left sinus exhibits no frontal sinus tenderness.  Mouth/Throat: Uvula is midline, oropharynx is clear and moist and mucous membranes are normal. No oropharyngeal exudate, posterior oropharyngeal edema or posterior oropharyngeal erythema.  Neck: Normal range of motion. Neck supple. No tracheal deviation present. No thyromegaly present.  Cardiovascular: Normal rate,  regular rhythm and normal heart sounds.  Exam reveals no gallop and no friction rub.   No murmur heard. Pulmonary/Chest: Effort normal and breath sounds normal. No stridor. No respiratory distress. She has no wheezes. She has no rales.  Abdominal: Soft. Bowel sounds are normal. She exhibits no distension and no mass. There is no tenderness. There is no rebound and no guarding.  Lymphadenopathy:    She has no cervical adenopathy.  Skin: She is not  diaphoretic.  Vitals reviewed.      Assessment & Plan:     1. Acute maxillary sinusitis, recurrence not specified Worsening symptoms that have not responded to OTC medications. Will give augmentin as below. Continue allergy medications. Stay well hydrated and get plenty of rest. Call if no symptom improvement or if symptoms worsen. - amoxicillin-clavulanate (AUGMENTIN) 875-125 MG tablet; Take 1 tablet by mouth 2 (two) times daily.  Dispense: 14 tablet; Refill: 0  2. Cough Worsening symptoms that has not responded to OTC medications. Will give Hycodan cough syrup as below for nighttime cough. Drowsiness precautions given to patient. Stay well hydrated. Use delsym, robitussin OR mucinex for daytime cough. - HYDROcodone-homatropine (HYCODAN) 5-1.5 MG/5ML syrup; Take 5 mLs by mouth every 8 (eight) hours as needed.  Dispense: 180 mL; Refill: 0  3. Body aches Flu test negative.  - POCT Influenza A/B  4. Antibiotic-induced yeast infection H/O yeast infection with antibiotic use. Will give diflucan for treatment if needed.  - fluconazole (DIFLUCAN) 150 MG tablet; Take 1 tablet (150 mg total) by mouth once.  Dispense: 1 tablet; Refill: 0       Mar Daring, PA-C  [facility]

## 2016-01-04 ENCOUNTER — Other Ambulatory Visit: Payer: Self-pay | Admitting: Family Medicine

## 2016-01-04 DIAGNOSIS — R1013 Epigastric pain: Secondary | ICD-10-CM

## 2016-01-07 NOTE — Telephone Encounter (Signed)
Last ov 01/01/16; last filled 03/04/15. Please review. Thank you. sd

## 2016-05-20 ENCOUNTER — Ambulatory Visit (INDEPENDENT_AMBULATORY_CARE_PROVIDER_SITE_OTHER): Payer: 59 | Admitting: Physician Assistant

## 2016-05-20 ENCOUNTER — Encounter: Payer: Self-pay | Admitting: Physician Assistant

## 2016-05-20 VITALS — BP 110/78 | HR 76 | Temp 98.0°F | Wt 208.8 lb

## 2016-05-20 DIAGNOSIS — M791 Myalgia, unspecified site: Secondary | ICD-10-CM

## 2016-05-20 DIAGNOSIS — R5382 Chronic fatigue, unspecified: Secondary | ICD-10-CM | POA: Diagnosis not present

## 2016-05-20 DIAGNOSIS — D751 Secondary polycythemia: Secondary | ICD-10-CM

## 2016-05-20 DIAGNOSIS — Z832 Family history of diseases of the blood and blood-forming organs and certain disorders involving the immune mechanism: Secondary | ICD-10-CM

## 2016-05-20 DIAGNOSIS — R002 Palpitations: Secondary | ICD-10-CM

## 2016-05-20 DIAGNOSIS — M083 Juvenile rheumatoid polyarthritis (seronegative): Secondary | ICD-10-CM

## 2016-05-20 DIAGNOSIS — M0580 Other rheumatoid arthritis with rheumatoid factor of unspecified site: Secondary | ICD-10-CM

## 2016-05-20 DIAGNOSIS — M7989 Other specified soft tissue disorders: Secondary | ICD-10-CM

## 2016-05-20 DIAGNOSIS — R413 Other amnesia: Secondary | ICD-10-CM

## 2016-05-20 DIAGNOSIS — R768 Other specified abnormal immunological findings in serum: Secondary | ICD-10-CM

## 2016-05-20 MED ORDER — FUROSEMIDE 20 MG PO TABS: 20.0000 mg | ORAL_TABLET | Freq: Every day | ORAL | 0 refills | Status: DC | PRN

## 2016-05-20 NOTE — Progress Notes (Signed)
Patient: Meghan [name] Female    DOB: Dec 30, 1970   46 y.o.   MRN: [id number] Visit Date: 05/25/2016  Today's Provider: Mar Daring, PA-C   Chief Complaint  Patient presents with  . Generalized Body Aches   Subjective:    HPI Generalized Body Aches:  This is a new problem. Episode onset: 1 month. The problem occurs constantly. The problem has been gradually worsening. Associated symptoms include fatigue, headaches and myalgias. Associated symptoms comments: Generalized body aches and memory unclear. Nothing aggravates the symptoms. She has tried nothing for the symptoms.   Patient reports working 65 hours per week and sitting mostly at work.    Previous Medications   CLONAZEPAM (KLONOPIN) 1 MG TABLET    Take 1 tablet (1 mg total) by mouth daily.   OMEPRAZOLE (PRILOSEC) 20 MG CAPSULE    TAKE 1 CAPSULE (20 MG TOTAL) BY MOUTH 2 (TWO) TIMES DAILY BEFORE A MEAL.   VITAMIN D, ERGOCALCIFEROL, (DRISDOL) 50000 UNITS CAPS CAPSULE    Take 1 capsule (50,000 Units total) by mouth every 7 (seven) days.    Review of Systems  Constitutional: Positive for fatigue.  HENT: Negative.   Respiratory: Negative.   Cardiovascular: Negative.   Gastrointestinal: Negative.   Musculoskeletal: Positive for arthralgias, joint swelling and myalgias.  Neurological: Positive for weakness and headaches. Negative for dizziness, seizures and numbness.  Psychiatric/Behavioral: Negative.     Social History  Substance Use Topics  . Smoking status: Current Every Day Smoker    Packs/day: 0.50    Types: Cigarettes    Last attempt to quit: 01/04/2010  . Smokeless tobacco: Never Used  . Alcohol use Yes     Comment: social   Objective:   BP 110/78 (BP Location: Right Arm, Patient Position: Sitting, Cuff Size: Normal)   Pulse 76   Temp 98 F (36.7 C) (Oral)   Wt 208 lb 12.8 oz (94.7 kg)   SpO2 97%   BMI 33.70 kg/m   Physical Exam  Constitutional: She appears well-developed and well-nourished. No  distress.  Neck: Normal range of motion. Neck supple. No JVD present. No tracheal deviation present. No thyromegaly present.  Cardiovascular: Normal rate, regular rhythm, normal heart sounds and intact distal pulses.  Exam reveals no gallop and no friction rub.   No murmur heard. Pulmonary/Chest: Effort normal and breath sounds normal. No respiratory distress. She has no wheezes. She has no rales.  Musculoskeletal: She exhibits no edema.  Swelling noted of the hands and fingers  Lymphadenopathy:    She has no cervical adenopathy.  Skin: She is not diaphoretic.  Psychiatric: She has a normal mood and affect. Her behavior is normal. Judgment and thought content normal.  Vitals reviewed.      Assessment & Plan:     1. Chronic fatigue Unsure of cause. Will check labs as below to R/O autoimmune source vs vitamin deficiency (does have h/o vit d def). No known recent tick exposure per patient. I will f/u with her pending results of the labs.  - CBC w/Diff/Platelet - Comprehensive Metabolic Panel (CMET) - Magnesium - Rheumatoid Factor - ANA w/Reflex if Positive - Sed Rate (ESR) - C-reactive protein - Vitamin D (25 hydroxy) - B12  2. Myalgia See above medical treatment plan. - CBC w/Diff/Platelet - Comprehensive Metabolic Panel (CMET) - Magnesium - Rheumatoid Factor - ANA w/Reflex if Positive - Sed Rate (ESR) - C-reactive protein - Vitamin D (25 hydroxy) - B12  3. Memory loss - CBC w/Diff/Platelet -  Comprehensive Metabolic Panel (CMET) - Magnesium - Rheumatoid Factor - ANA w/Reflex if Positive - Sed Rate (ESR) - C-reactive protein - Vitamin D (25 hydroxy) - B12  4. Polycythemia, secondary H/O this secondary to smoking. Will check labs as below and f/u pending results. - CBC w/Diff/Platelet - Comprehensive Metabolic Panel (CMET) - Magnesium - Rheumatoid Factor - ANA w/Reflex if Positive - Sed Rate (ESR) - C-reactive protein - Vitamin D (25 hydroxy) - B12  5.  Family history of autoimmune disorder Family history of some autoimmune disease in her grandmother. Could not remember what she actually had.  - Rheumatoid Factor - ANA w/Reflex if Positive  6. Heart palpitations Has had work up with cardiology (Dr. Ubaldo Glassing) and was found to have PVCs. No changes.   7. Swelling of both hands Fluid pill given for the interim for the swelling in her hands until she can be evaluated by Rheumatology. She did have positive ANA and positive RF with elevated ESR.  - furosemide (LASIX) 20 MG tablet; Take 1 tablet (20 mg total) by mouth daily as needed.  Dispense: 30 tablet; Refill: 0  8. Polyarthritis with positive rheumatoid factor ([place]) Labs did come back positive for RF. Referral placed as below.  - Ambulatory referral to Rheumatology  9. Positive ANA (antinuclear antibody) ANA was positive for ENA SSA (RO) Ab. Referral placed as below.  - Ambulatory referral to Rheumatology   Follow up: Return if symptoms worsen or fail to improve.

## 2016-05-20 NOTE — Patient Instructions (Signed)
Menopause and Herbal Products What is menopause? Menopause is the normal time of life when menstrual periods decrease in frequency and eventually stop completely. This process can take several years for some women. Menopause is complete when you have had an absence of menstruation for a full year since your last menstrual period. It usually occurs between the ages of 48 and 55. It is not common for menopause to begin before the age of 40. During menopause, your body stops producing the female hormones estrogen and progesterone. Common symptoms associated with this loss of hormones (vasomotor symptoms) are:  Hot flashes.  Hot flushes.  Night sweats.  Other common symptoms and complications of menopause include:  Decrease in sex drive.  Vaginal dryness and thinning of the walls of the vagina. This can make sex painful.  Dryness of the skin and development of wrinkles.  Headaches.  Tiredness.  Irritability.  Memory problems.  Weight gain.  Bladder infections.  Hair growth on the face and chest.  Inability to reproduce offspring (infertility).  Loss of density in the bones (osteoporosis) increasing your risk for breaks (fractures).  Depression.  Hardening and narrowing of the arteries (atherosclerosis). This increases your risk of heart attack and stroke.  What treatment options are available? There are many treatment choices for menopause symptoms. The most common treatment is hormone replacement therapy. Many alternative therapies for menopause are emerging, including the use of herbal products. These supplements can be found in the form of herbs, teas, oils, tinctures, and pills. Common herbal supplements for menopause are made from plants that contain phytoestrogens. Phytoestrogens are compounds that occur naturally in plants and plant products. They act like estrogen in the body. Foods and herbs that contain phytoestrogens include:  Soy.  Flax seeds.  Red  clover.  Ginseng.  What menopause symptoms may be helped if I use herbal products?  Vasomotor symptoms. These may be helped by: ? Soy. Some studies show that soy may have a moderate benefit for hot flashes. ? Black cohosh. There is limited evidence indicating this may be beneficial for hot flashes.  Symptoms that are related to heart and blood vessel disease. These may be helped by soy. Studies have shown that soy can help to lower cholesterol.  Depression. This may be helped by: ? St. John's wort. There is limited evidence that shows this may help mild to moderate depression. ? Black cohosh. There is evidence that this may help depression and mood swings.  Osteoporosis. Soy may help to decrease bone loss that is associated with menopause and may prevent osteoporosis. Limited evidence indicates that red clover may offer some bone loss protection as well. Other herbal products that are commonly used during menopause lack enough evidence to support their use as a replacement for conventional menopause therapies. These products include evening primrose, ginseng, and red clover. What are the cases when herbal products should not be used during menopause? Do not use herbal products during menopause without your health care provider's approval if:  You are taking medicine.  You have a preexisting liver condition.  Are there any risks in my taking herbal products during menopause? If you choose to use herbal products to help with symptoms of menopause, keep in mind that:  Different supplements have different and unmeasured amounts of herbal ingredients.  Herbal products are not regulated the same way that medicines are.  Concentrations of herbs may vary depending on the way they are prepared. For example, the concentration may be different in a pill,   tea, oil, and tincture.  Little is known about the risks of using herbal products, particularly the risks of long-term use.  Some herbal  supplements can be harmful when combined with certain medicines.  Most commonly reported side effects of herbal products are mild. However, if used improperly, many herbal supplements can cause serious problems. Talk to your health care provider before starting any herbal product. If problems develop, stop taking the supplement and let your health care provider know. This information is not intended to replace advice given to you by your health care provider. Make sure you discuss any questions you have with your health care provider. Document Released: 06/17/2007 Document Revised: 11/26/2015 Document Reviewed: 06/13/2013 Elsevier Interactive Patient Education  2017 Elsevier Inc.  

## 2016-05-21 ENCOUNTER — Telehealth: Payer: Self-pay

## 2016-05-21 LAB — CBC WITH DIFFERENTIAL/PLATELET
BASOS: 0 %
Basophils Absolute: 0 10*3/uL (ref 0.0–0.2)
EOS (ABSOLUTE): 0.2 10*3/uL (ref 0.0–0.4)
EOS: 2 %
Hematocrit: 41.7 % (ref 34.0–46.6)
Hemoglobin: 14.3 g/dL (ref 11.1–15.9)
IMMATURE GRANULOCYTES: 0 %
Immature Grans (Abs): 0 10*3/uL (ref 0.0–0.1)
LYMPHS: 36 %
Lymphocytes Absolute: 2.7 10*3/uL (ref 0.7–3.1)
MCH: 29 pg (ref 26.6–33.0)
MCHC: 34.3 g/dL (ref 31.5–35.7)
MCV: 85 fL (ref 79–97)
Monocytes Absolute: 0.6 10*3/uL (ref 0.1–0.9)
Monocytes: 7 %
NEUTROS ABS: 4.2 10*3/uL (ref 1.4–7.0)
Neutrophils: 55 %
PLATELETS: 256 10*3/uL (ref 150–379)
RBC: 4.93 x10E6/uL (ref 3.77–5.28)
RDW: 13.6 % (ref 12.3–15.4)
WBC: 7.7 10*3/uL (ref 3.4–10.8)

## 2016-05-21 LAB — VITAMIN D 25 HYDROXY (VIT D DEFICIENCY, FRACTURES): Vit D, 25-Hydroxy: 23.8 ng/mL — ABNORMAL LOW (ref 30.0–100.0)

## 2016-05-21 LAB — C-REACTIVE PROTEIN: CRP: 2.5 mg/L (ref 0.0–4.9)

## 2016-05-21 LAB — COMPREHENSIVE METABOLIC PANEL
ALT: 25 IU/L (ref 0–32)
AST: 20 IU/L (ref 0–40)
Albumin/Globulin Ratio: 0.9 — ABNORMAL LOW (ref 1.2–2.2)
Albumin: 4.2 g/dL (ref 3.5–5.5)
Alkaline Phosphatase: 101 IU/L (ref 39–117)
BUN/Creatinine Ratio: 20 (ref 9–23)
BUN: 14 mg/dL (ref 6–24)
Bilirubin Total: 0.4 mg/dL (ref 0.0–1.2)
CALCIUM: 9.3 mg/dL (ref 8.7–10.2)
CO2: 22 mmol/L (ref 18–29)
CREATININE: 0.71 mg/dL (ref 0.57–1.00)
Chloride: 98 mmol/L (ref 96–106)
GFR, EST AFRICAN AMERICAN: 118 mL/min/{1.73_m2} (ref >59)
GFR, EST NON AFRICAN AMERICAN: 102 mL/min/{1.73_m2} (ref >59)
GLUCOSE: 89 mg/dL (ref 65–99)
Globulin, Total: 4.7 g/dL — ABNORMAL HIGH (ref 1.5–4.5)
POTASSIUM: 4.4 mmol/L (ref 3.5–5.2)
Sodium: 136 mmol/L (ref 134–144)
Total Protein: 8.9 g/dL — ABNORMAL HIGH (ref 6.0–8.5)

## 2016-05-21 LAB — ANA W/REFLEX IF POSITIVE
ANA: POSITIVE — AB
Chromatin Ab SerPl-aCnc: 0.2 AI (ref 0.0–0.9)
DSDNA AB: 4 [IU]/mL (ref 0–9)
ENA RNP Ab: <0.2 AI (ref 0.0–0.9)
ENA SM Ab Ser-aCnc: <0.2 AI (ref 0.0–0.9)
ENA SSB (LA) Ab: 0.2 AI (ref 0.0–0.9)
Scleroderma SCL-70: <0.2 AI (ref 0.0–0.9)

## 2016-05-21 LAB — MAGNESIUM: Magnesium: 2.2 mg/dL (ref 1.6–2.3)

## 2016-05-21 LAB — SEDIMENTATION RATE: SED RATE: 48 mm/h — AB (ref 0–32)

## 2016-05-21 LAB — RHEUMATOID FACTOR: RHEUMATOID FACTOR: 96.6 [IU]/mL — AB (ref 0.0–13.9)

## 2016-05-21 LAB — VITAMIN B12: Vitamin B-12: 426 pg/mL (ref 232–1245)

## 2016-05-21 NOTE — Telephone Encounter (Signed)
Patient advised as directed below. Answer all questions that patient had best to my knowledge and advised that if she had any more questions to send Tawanna Sat a message through my chart.  Thanks,  -Joseline

## 2016-05-21 NOTE — Telephone Encounter (Signed)
lmtcb Meghan [name], CMA  

## 2016-05-21 NOTE — Telephone Encounter (Signed)
-----   Message from Mar Daring, [place] sent at 05/21/2016  1:53 PM EDT ----- Vit D level is still borderline low. There were a few of the autoimmune markers that have come back positive as well as the lab for inflammation. I will refer to rheumatology for further evaluation and testing to confirm these labs and try to figure out what is going on.

## 2016-05-22 ENCOUNTER — Other Ambulatory Visit: Payer: Self-pay | Admitting: Family Medicine

## 2016-05-22 ENCOUNTER — Encounter: Payer: Self-pay | Admitting: Physician Assistant

## 2016-05-22 DIAGNOSIS — K219 Gastro-esophageal reflux disease without esophagitis: Secondary | ICD-10-CM

## 2016-05-22 NOTE — Telephone Encounter (Signed)
Called [facility] and they do have enough if for any reason they are unable to run the test they will le [place] know. Dx: same and order code #: [id number]  Thanks,  -Joseline

## 2016-05-22 NOTE — Telephone Encounter (Signed)
Meghan [name],  Can you please call [facility] to see if they can add a lyme panel with the specimens they have for Meghan [name] or if they are gonna need a new sample.  Thanks, JB

## 2016-05-23 MED ORDER — SUCRALFATE 1 G PO TABS: 1.0000 g | ORAL_TABLET | Freq: Three times a day (TID) | ORAL | 5 refills | Status: DC

## 2016-05-26 ENCOUNTER — Telehealth: Payer: Self-pay

## 2016-05-26 LAB — C6 B. BURGDORFERI (LYME)

## 2016-05-26 LAB — SPECIMEN STATUS REPORT

## 2016-05-26 NOTE — Telephone Encounter (Signed)
-----   Message from Mar Daring, [place] sent at 05/26/2016  4:02 PM EDT ----- Lyme titer is negative

## 2016-05-26 NOTE — Telephone Encounter (Signed)
Patient advised as below.  

## 2016-06-01 DIAGNOSIS — M059 Rheumatoid arthritis with rheumatoid factor, unspecified: Secondary | ICD-10-CM | POA: Insufficient documentation

## 2016-06-01 DIAGNOSIS — R768 Other specified abnormal immunological findings in serum: Secondary | ICD-10-CM | POA: Insufficient documentation

## 2016-06-16 ENCOUNTER — Other Ambulatory Visit: Payer: Self-pay | Admitting: Physician Assistant

## 2016-06-16 DIAGNOSIS — M7989 Other specified soft tissue disorders: Secondary | ICD-10-CM

## 2016-07-06 ENCOUNTER — Telehealth: Payer: Self-pay

## 2016-07-06 MED ORDER — CLONAZEPAM 1 MG PO TABS: 1.0000 mg | ORAL_TABLET | Freq: Every day | ORAL | 0 refills | Status: DC

## 2016-07-06 NOTE — Telephone Encounter (Signed)
Per Dr. Daron Offer a 30 day supply of patients Klonopin was called in to the pharmacy, I called the patient and lvm letting her know this was done.

## 2016-07-06 NOTE — Telephone Encounter (Signed)
I reviewed the [place] Controlled substance database.  Okay to provide 1 month refill.  Thank you

## 2016-07-06 NOTE — Telephone Encounter (Signed)
This is a Dr. Weber Cooks pt.  Dr. Weber Cooks is out this week.   Pt called states that she is out of refills on her klonopin.  pt states she has appt on 07-28-16.  Pt was last seen on  12-09-15. She states she doesn't take all the time.    clonazePAM (KLONOPIN) 1 MG tablet  Medication  Date: 12/09/2015 Department: [facility] Ordering/Authorizing: Clapacs, Madie Reno, MD  Order Providers   Prescribing Provider Encounter Provider  Clapacs, Madie Reno, MD Clapacs, Madie Reno, MD  Medication Detail    Disp Refills Start End   clonazePAM (KLONOPIN) 1 MG tablet 30 tablet 5 12/09/2015    Sig - Route: Take 1 tablet (1 mg total) by mouth daily. - Oral   Class: Print

## 2016-07-07 NOTE — Telephone Encounter (Signed)
Thank you :)

## 2016-07-28 ENCOUNTER — Ambulatory Visit: Payer: 59 | Admitting: Psychiatry

## 2016-09-16 ENCOUNTER — Encounter: Payer: Self-pay | Admitting: Physician Assistant

## 2016-09-16 ENCOUNTER — Ambulatory Visit (INDEPENDENT_AMBULATORY_CARE_PROVIDER_SITE_OTHER): Payer: 59 | Admitting: Physician Assistant

## 2016-09-16 VITALS — BP 122/70 | HR 81 | Temp 98.3°F | Resp 16 | Wt 206.2 lb

## 2016-09-16 DIAGNOSIS — M05742 Rheumatoid arthritis with rheumatoid factor of left hand without organ or systems involvement: Secondary | ICD-10-CM | POA: Diagnosis not present

## 2016-09-16 DIAGNOSIS — M05741 Rheumatoid arthritis with rheumatoid factor of right hand without organ or systems involvement: Secondary | ICD-10-CM | POA: Diagnosis not present

## 2016-09-16 DIAGNOSIS — M542 Cervicalgia: Secondary | ICD-10-CM

## 2016-09-16 DIAGNOSIS — F411 Generalized anxiety disorder: Secondary | ICD-10-CM | POA: Diagnosis not present

## 2016-09-16 DIAGNOSIS — M62838 Other muscle spasm: Secondary | ICD-10-CM

## 2016-09-16 MED ORDER — CYCLOBENZAPRINE HCL 5 MG PO TABS: 5.0000 mg | ORAL_TABLET | Freq: Three times a day (TID) | ORAL | 1 refills | Status: DC | PRN

## 2016-09-16 MED ORDER — MELOXICAM 15 MG PO TABS: 15.0000 mg | ORAL_TABLET | Freq: Every day | ORAL | 0 refills | Status: DC

## 2016-09-16 MED ORDER — CLONAZEPAM 1 MG PO TABS: 1.0000 mg | ORAL_TABLET | Freq: Every day | ORAL | 5 refills | Status: DC

## 2016-09-16 NOTE — Patient Instructions (Signed)

## 2016-09-16 NOTE — Progress Notes (Signed)
Patient: Meghan [name] Female    DOB: 02/03/1970   46 y.o.   MRN: [id number] Visit Date: 09/16/2016  Today's Provider: Mar Daring, PA-C   Chief Complaint  Patient presents with  . Back Pain  . Neck Pain   Subjective:    Back Pain  This is a new problem. The current episode started 1 to 4 weeks ago. The problem occurs constantly. The problem has been gradually worsening since onset. Pain location: upper back in the center. The quality of the pain is described as aching. The pain is at a severity of 5/10. The pain is moderate. The pain is the same all the time. Exacerbated by: by touching. Associated symptoms include headaches. Pertinent negatives include no abdominal pain, chest pain, numbness, tingling or weakness. Treatments tried: tylenol. The treatment provided no relief.  Neck Pain   This is a new problem. The current episode started 1 to 4 weeks ago. The problem occurs constantly. The problem has been gradually worsening. The pain is associated with nothing. The pain is present in the anterior neck. The quality of the pain is described as aching. The pain is at a severity of 5/10. The pain is moderate. Nothing aggravates the symptoms. The pain is same all the time. Associated symptoms include headaches and a visual change ("thinks is from the prednisone"). Pertinent negatives include no chest pain, numbness, pain with swallowing, tingling, trouble swallowing or weakness. Associated symptoms comments: Ear painn,bilateral. She has tried acetaminophen for the symptoms. The treatment provided no relief.   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 Annalee Genta, endocrine. Patient reports that it has been 2 weeks that she has not taken the Methotrexate because her hair was falling out and she did not feel it was working. She has not taken the Prednisone for 2 weeks as well. She reports that she is unsure if RA is her true  diagnosis as her fingers are still swollen despite treatment and she is not currently having pain in her hands. She felt nothing was ever explained to her, that she was only put on medications. There is a strong family history of multiple different autoimmune disorders.   She has also been under a lot of stress. She has been working two jobs, and her part-time job recently increased hours required to work to stay employed there from 20 to 25 hours per week. This is on top of her regular job at [employer]. She is also helping her daughter pay and plan a wedding that is in December. She is also trying to keep the second job until after her daughter's bridal shower because she has multiple rooms blocked off at her discounted employee rate and does not want incoming family members to have to pay full price for the room when they come to town.    Allergies  Allergen Reactions  . Iodine   . Sulfonamide Derivatives     REACTION: face edema     Current Outpatient Prescriptions:  .  clonazePAM (KLONOPIN) 1 MG tablet, Take 1 tablet (1 mg total) by mouth daily., Disp: 30 tablet, Rfl: 0 .  furosemide (LASIX) 20 MG tablet, TAKE 1 TABLET (20 MG TOTAL) BY MOUTH DAILY AS NEEDED., Disp: 30 tablet, Rfl: 0 .  omeprazole (PRILOSEC) 20 MG capsule, TAKE 1 CAPSULE (20 MG TOTAL) BY MOUTH 2 (TWO) TIMES DAILY BEFORE A MEAL., Disp: 60 capsule, Rfl: 1 .  sucralfate (CARAFATE) 1 g tablet, Take 1 tablet (1 g total) by mouth 4 (four) times daily -  with meals and at bedtime., Disp: 28 tablet, Rfl: 5 .  Vitamin D, Ergocalciferol, (DRISDOL) 50000 units CAPS capsule, Take 1 capsule (50,000 Units total) by mouth every 7 (seven) days., Disp: 15 capsule, Rfl: 0 .  methotrexate (RHEUMATREX) 2.5 MG tablet, Take 7 tabs (17.5 mg ) once a week, Disp: , Rfl:  .  predniSONE (DELTASONE) 5 MG tablet, Take 7.5 mg (1 tab and a half), 30 days, Disp: , Rfl:   Review of Systems  Constitutional: Negative.   HENT: Negative for congestion, ear  pain, facial swelling, mouth sores, nosebleeds, postnasal drip, rhinorrhea, sinus pain, sinus pressure, sneezing, sore throat, tinnitus and trouble swallowing.   Eyes: Negative for visual disturbance.  Respiratory: Negative for cough, chest tightness, shortness of breath and wheezing.   Cardiovascular: Negative for chest pain, palpitations and leg swelling.  Gastrointestinal: Negative for abdominal pain and nausea.  Musculoskeletal: Positive for back pain (upper back between shoulder blades), joint swelling (hands), myalgias and neck pain. Negative for arthralgias, gait problem and neck stiffness.  Neurological: Positive for headaches. Negative for dizziness, tingling, weakness, light-headedness and numbness.  Psychiatric/Behavioral: The patient is nervous/anxious.     Social History  Substance Use Topics  . Smoking status: Current Every Day Smoker    Packs/day: 0.50    Types: Cigarettes    Last attempt to quit: 01/04/2010  . Smokeless tobacco: Never Used  . Alcohol use Yes     Comment: social   Objective:   BP 122/70 (BP Location: Left Arm, Patient Position: Sitting, Cuff Size: Normal)   Pulse 81   Temp 98.3 F (36.8 C) (Oral)   Resp 16   Wt 206 lb 3.2 oz (93.5 kg)   BMI 33.28 kg/m  Vitals:   09/16/16 1556  BP: 122/70  Pulse: 81  Resp: 16  Temp: 98.3 F (36.8 C)  TempSrc: Oral  Weight: 206 lb 3.2 oz (93.5 kg)     Physical Exam  Constitutional: She appears well-developed and well-nourished. No distress.  Neck: Normal range of motion. Neck supple. No JVD present. No tracheal deviation present. No thyromegaly present.  Cardiovascular: Normal rate, regular rhythm and normal heart sounds.  Exam reveals no gallop and no friction rub.   No murmur heard. Pulmonary/Chest: Effort normal and breath sounds normal. No respiratory distress. She has no wheezes. She has no rales.  Musculoskeletal: She exhibits no edema.       Cervical back: She exhibits tenderness, bony tenderness  (over c7 prominence), swelling (soft tissue swelling noted over C6-C7 area of cervical spine; ttp) and spasm (upper trap bilaterally with R>L). She exhibits normal range of motion, no deformity and no pain.  Swelling noticed over PIP and DIP joints of hands bilaterally; no stiffness or pain  Lymphadenopathy:    She has no cervical adenopathy.  Skin: She is not diaphoretic.  Vitals reviewed.      Assessment & Plan:     1. Muscle spasm Worsening. Suspect due to stress and lack of sleep due to working so much. Discussed not continuing the second job as it is not needed financially for herself and her husband. Discussed yoga, stretching and relaxation techniques. Warm compresses/heating pad prn. Flexeril and meloxicam given as below. Cervical spine xray obtained to evaluate cervical spine for arthritic changes vs soft tissue spasm/swelling. I will f/u pending results and she is to call if symptoms worsen.  -  cyclobenzaprine (FLEXERIL) 5 MG tablet; Take 1 tablet (5 mg total) by mouth 3 (three) times daily as needed for muscle spasms.  Dispense: 30 tablet; Refill: 1 - meloxicam (MOBIC) 15 MG tablet; Take 1 tablet (15 mg total) by mouth daily.  Dispense: 30 tablet; Refill: 0  2. Cervical spine pain See above medical treatment plan. - meloxicam (MOBIC) 15 MG tablet; Take 1 tablet (15 mg total) by mouth daily.  Dispense: 30 tablet; Refill: 0 - DG Cervical Spine Complete; Future  3. Rheumatoid arthritis involving both hands with positive rheumatoid factor ([place]) Patient not due to f/u until Oct 2018. Patient now off medications. Discussed why we treat RA to prevent flares and why it is important for her to continue medications as prescribed and if she is having side effects she needs to notify Dr. Annalee Genta as there are other options she may be willing to consider.   4. GAD (generalized anxiety disorder) Stable. Diagnosis pulled for medication refill. Continue current medical treatment plan. -  clonazePAM (KLONOPIN) 1 MG tablet; Take 1 tablet (1 mg total) by mouth daily.  Dispense: 30 tablet; Refill: [place], PA-C  [facility]

## 2016-09-17 ENCOUNTER — Ambulatory Visit
Admission: RE | Admit: 2016-09-17 | Discharge: 2016-09-17 | Disposition: A | Discharge status: Home / Self Care | Payer: 59 | Source: Ambulatory Visit | Attending: Physician Assistant | Admitting: Physician Assistant

## 2016-09-17 ENCOUNTER — Telehealth: Payer: Self-pay

## 2016-09-17 DIAGNOSIS — M50323 Other cervical disc degeneration at C6-C7 level: Secondary | ICD-10-CM | POA: Insufficient documentation

## 2016-09-17 DIAGNOSIS — M542 Cervicalgia: Secondary | ICD-10-CM

## 2016-09-17 NOTE — Telephone Encounter (Signed)
-----   Message from Mar Daring, [place] sent at 09/17/2016 10:22 AM EDT ----- DDD noted at C6-C7 with osteophyte formation (arthritic changes). No soft tissue abnormality noted. Suspect swelling is from the arthritic changes and muscle spasm.

## 2016-09-17 NOTE — Telephone Encounter (Signed)
We will fax office notes also. Notes and imageing results faxed over to [facility].  Thanks,  -Rogelio Winbush

## 2016-09-17 NOTE — Telephone Encounter (Signed)
Patient advised as directed below. Patient is requesting a copy of imaging to be fax to her Rheumatologist.  Thanks,  -Dezhane Staten

## 2016-10-13 ENCOUNTER — Other Ambulatory Visit: Payer: Self-pay | Admitting: Physician Assistant

## 2016-10-13 DIAGNOSIS — M542 Cervicalgia: Secondary | ICD-10-CM

## 2016-10-13 DIAGNOSIS — M62838 Other muscle spasm: Secondary | ICD-10-CM

## 2016-11-26 ENCOUNTER — Other Ambulatory Visit: Payer: Self-pay | Admitting: Physician Assistant

## 2016-11-26 DIAGNOSIS — Z1231 Encounter for screening mammogram for malignant neoplasm of breast: Secondary | ICD-10-CM

## 2016-12-14 ENCOUNTER — Telehealth: Payer: Self-pay | Admitting: Family Medicine

## 2016-12-14 DIAGNOSIS — K219 Gastro-esophageal reflux disease without esophagitis: Secondary | ICD-10-CM

## 2016-12-14 DIAGNOSIS — R1013 Epigastric pain: Secondary | ICD-10-CM

## 2016-12-14 MED ORDER — OMEPRAZOLE 20 MG PO CPDR: 20.0000 mg | DELAYED_RELEASE_CAPSULE | Freq: Two times a day (BID) | ORAL | 1 refills | Status: DC

## 2016-12-14 MED ORDER — SUCRALFATE 1 G PO TABS: 1.0000 g | ORAL_TABLET | Freq: Three times a day (TID) | ORAL | 1 refills | Status: DC

## 2016-12-14 NOTE — Telephone Encounter (Signed)
[facility] faxed a refill request for the following medications. Thanks CC  omeprazole (PRILOSEC) 20 MG capsule   sucralfate (CARAFATE) 1 g tablet

## 2016-12-14 NOTE — Telephone Encounter (Signed)
See refill request.

## 2016-12-14 NOTE — Telephone Encounter (Signed)
meds refilled 

## 2016-12-14 NOTE — Telephone Encounter (Signed)
Please review. KW 

## 2016-12-16 ENCOUNTER — Other Ambulatory Visit: Payer: Self-pay | Admitting: Physician Assistant

## 2016-12-16 DIAGNOSIS — R1013 Epigastric pain: Secondary | ICD-10-CM

## 2016-12-16 DIAGNOSIS — K219 Gastro-esophageal reflux disease without esophagitis: Secondary | ICD-10-CM

## 2016-12-16 MED ORDER — OMEPRAZOLE 20 MG PO CPDR: 20.0000 mg | DELAYED_RELEASE_CAPSULE | Freq: Two times a day (BID) | ORAL | 1 refills | Status: DC

## 2016-12-16 MED ORDER — SUCRALFATE 1 G PO TABS: 1.0000 g | ORAL_TABLET | Freq: Three times a day (TID) | ORAL | 1 refills | Status: DC

## 2016-12-16 NOTE — Telephone Encounter (Signed)
[facility] faxed refill request for the following medications:  1. sucralfate (CARAFATE) 1 g tablet 2. omeprazole (PRILOSEC) 20 MG capsule  90 day supply  I called pt to verify she wanted Rx sent to [facility]. Pt advised that she does because her insurance will no longer cover the medication at local pharmacy.Please advise. Thanks TNP

## 2016-12-17 ENCOUNTER — Ambulatory Visit (INDEPENDENT_AMBULATORY_CARE_PROVIDER_SITE_OTHER): Payer: 59 | Admitting: Obstetrics & Gynecology

## 2016-12-17 ENCOUNTER — Encounter: Payer: Self-pay | Admitting: Obstetrics & Gynecology

## 2016-12-17 VITALS — BP 125/84 | HR 103

## 2016-12-17 DIAGNOSIS — Z803 Family history of malignant neoplasm of breast: Secondary | ICD-10-CM

## 2016-12-17 DIAGNOSIS — Z01419 Encounter for gynecological examination (general) (routine) without abnormal findings: Secondary | ICD-10-CM | POA: Diagnosis not present

## 2016-12-17 NOTE — Progress Notes (Signed)
Subjective:    Meghan [name] is a 46 y.o. married White P2 (55 and 18 yo kids) female who presents for an annual exam. The patient has no complaints today. The patient is sexually active. GYN screening history: last pap: was normal. The patient wears seatbelts: yes. The patient participates in regular exercise: yes. Has the patient ever been transfused or tattooed?: no. The patient reports that there is not domestic violence in her life.   Menstrual History: OB History    Gravida Para Term Preterm AB Living   2 2           SAB TAB Ectopic Multiple Live Births                  Menarche age: 64 No LMP recorded. Patient has had an ablation.    The following portions of the patient's history were reviewed and updated as appropriate: allergies, current medications, past family history, past medical history, past social history, past surgical history and problem list.  Review of Systems Pertinent items are noted in HPI.   Married for 21 years, denies dyspareunia, some postcoital bleeding FH-+ breast cancer in mom, mat aunt x 2, and several cousins on her mom's side No gyn/colon cancer Works at [employer] Diagnosed with RA recently   Objective:    BP 125/84   Pulse (!) 103   General Appearance:    Alert, cooperative, no distress, appears stated age  Head:    Normocephalic, without obvious abnormality, atraumatic  Eyes:    PERRL, conjunctiva/corneas clear, EOM's intact, fundi    benign, both eyes  Ears:    Normal TM's and external ear canals, both ears  Nose:   Nares normal, septum midline, mucosa normal, no drainage    or sinus tenderness  Throat:   Lips, mucosa, and tongue normal; teeth and gums normal  Neck:   Supple, symmetrical, trachea midline, no adenopathy;    thyroid:  no enlargement/tenderness/nodules; no carotid   bruit or JVD  Back:     Symmetric, no curvature, ROM normal, no CVA tenderness  Lungs:     Clear to auscultation bilaterally, respirations unlabored  Chest  Wall:    No tenderness or deformity   Heart:    Regular rate and rhythm, S1 and S2 normal, no murmur, rub   or gallop  Breast Exam:    No tenderness, masses, or nipple abnormality  Abdomen:     Soft, non-tender, bowel sounds active all four quadrants,    no masses, no organomegaly  Genitalia:    Normal female without lesion, discharge or tenderness     Extremities:   Extremities normal, atraumatic, no cyanosis or edema  Pulses:   2+ and symmetric all extremities  Skin:   Skin color, texture, turgor normal, no rashes or lesions  Lymph nodes:   Cervical, supraclavicular, and axillary nodes normal  Neurologic:   CNII-XII intact, normal strength, sensation and reflexes    throughout  .    Assessment:    Healthy female exam.   Strong FH of breast cancer   Plan:     Thin prep Pap smear. with cotesting Mammogram this Friday Genetic testing for BRCA Encouraged to stop smoking

## 2016-12-17 NOTE — Progress Notes (Signed)
Pt presents for her Annual Exam today.  Last pap 08/2014 - Normal Needs screening MM scheduled for next Friday.  Pt states in the last 6 months she has had bleeding after intercourse.  Dx with RA this year. Pt states she has noticed a lot of fatigue lately.

## 2016-12-20 LAB — IGP, APTIMA HPV, RFX 16/18,45
HPV APTIMA: NEGATIVE
PAP Smear Comment: 0

## 2016-12-25 ENCOUNTER — Ambulatory Visit
Admission: RE | Admit: 2016-12-25 | Discharge: 2016-12-25 | Disposition: A | Discharge status: Home / Self Care | Payer: 59 | Source: Ambulatory Visit | Attending: Physician Assistant | Admitting: Physician Assistant

## 2016-12-25 DIAGNOSIS — Z1231 Encounter for screening mammogram for malignant neoplasm of breast: Secondary | ICD-10-CM | POA: Diagnosis not present

## 2016-12-28 ENCOUNTER — Telehealth: Payer: Self-pay

## 2016-12-28 ENCOUNTER — Encounter: Payer: Self-pay | Admitting: Physician Assistant

## 2016-12-28 DIAGNOSIS — K219 Gastro-esophageal reflux disease without esophagitis: Secondary | ICD-10-CM

## 2016-12-28 NOTE — Telephone Encounter (Signed)
-----   Message from Mar Daring, [place] sent at 12/28/2016 11:17 AM EST ----- Normal mammogram. Repeat screening in one year.

## 2016-12-28 NOTE — Telephone Encounter (Signed)
Patient advised as below.  

## 2016-12-30 MED ORDER — OMEPRAZOLE 40 MG PO CPDR: 40.0000 mg | DELAYED_RELEASE_CAPSULE | Freq: Every day | ORAL | 1 refills | Status: DC

## 2016-12-30 NOTE — Addendum Note (Signed)
Addended by: Mar Daring on: 12/30/2016 11:21 AM   Modules accepted: Orders

## 2016-12-30 NOTE — Telephone Encounter (Signed)
Josie,  Didn't we have a PA on this? Have you seen if it were approved or not?  Grace Bushy, PA-C

## 2017-01-01 LAB — COMPREHENSIVE BRCA1/2 ANALYSIS

## 2017-01-01 LAB — BRCASSURE COMPREHENSIVE TEST

## 2017-01-06 ENCOUNTER — Telehealth: Payer: Self-pay

## 2017-01-06 DIAGNOSIS — R899 Unspecified abnormal finding in specimens from other organs, systems and tissues: Secondary | ICD-10-CM

## 2017-01-06 NOTE — Telephone Encounter (Signed)
Received call/LAB results regarding BRCA 2. Results came back positive for a gene but they are unsure which one. They are recommended generic counseling. Referral place I have called patient to make her aware of results. No answer or voice mail to leave a message.

## 2017-01-11 ENCOUNTER — Telehealth: Payer: Self-pay | Admitting: Genetic Counselor

## 2017-01-11 ENCOUNTER — Encounter: Payer: Self-pay | Admitting: Genetic Counselor

## 2017-01-11 NOTE — Telephone Encounter (Signed)
Cancer Genetics            Telegenetics Initial Visit   Patient Name: Meghan [name] Patient DOB: April 18, 1970 Patient Age: 46 y.o. Phone Call Date: 01/11/2017  Referring Provider: Clovia Cuff, MD  Reason for Visit: Discuss results of genetic testing   History of Present Illness: Ms. Meghan [name], a 46 y.o. female, was referred for genetic counseling to discuss results of recent genetic testing she had coordinated by Dr. Hulan Fray. This was a telegenetics visit via phone.  Ms. Meghan [name] has no personal history of cancer.  Ms. Meghan [name] genetic analysis included only the BRCA1 and BRCA2 genes  Test Results: Testing did not reveal any pathogenic mutation in any of these genes.  A copy of the genetic test report will be scanned into Epic under the media tab.  A Variant of Uncertain Significance was detected: BRCA2 [id number] ([id number]). This is still considered a normal result. While at this time, it is unknown if this finding is associated with increased cancer risk or not, the majority of these variants get reclassified to be inconsequential. We emphasized that medical management should not be based on this finding. With time, we suspect the lab will determine the significance, if any. If [facility] does reclassify it, they will let the ordering provider, Dr. Hulan Fray know. It is important that Ms. Meghan [name] stay in touch with the ordering provider periodically and keep the address and phone number up to date.  Since the current test is not perfect, it is possible that there may be a gene mutation that current testing cannot detect, but that chance is small. It is possible that a different genetic factor, which has not yet been discovered or is not on this panel, is responsible for the cancer diagnoses in the family. Again, the likelihood of this is low, but we discussed that she may wish to undergo testing utilizing a larger gene panel. Lastly, it may be that there is a detectable  mutation in her family that she did not inherit. No additional testing is recommended at this time for Ms. Meghan [name]. We discussed that her mother would benefit from a genetics evaluation given that she had breast cancer.  Family History: Significant diagnoses include the following:  Family History  Problem Relation Age of Onset  . Heart attack Father 73       HAD cabg, HAD DEFIBRILLATOR   . Diabetes Father   . Depression Father   . Heart disease Father   . Bladder Cancer Father        deceased at 5  . Hyperlipidemia Mother   . Breast cancer Mother 76       Meghan [name] in late 85s; currently 46yo  . Anxiety disorder Sister   . Schizophrenia Brother   . Anxiety disorder Brother   . Anxiety disorder Sister   . Breast cancer Maternal Aunt        deceased in 50s/60s  . Breast cancer Cousin        2 first cousins  . Lymphoma Maternal Uncle        deceased    Additionally, Ms. Meghan [name] has a son (age 48) and a daughter (age 8). She has one full sister (age 52). She has one maternal half-sister (age 63) and one paternal half-brother (age 88). Her mother had a Meghan [name] in her late 68s and then developed breast cancer at age 23. Her mother had 5 brothers and  3 sisters. Her father had 2 sisters and a brother. There are no known cancers in any of her grandparents.  Ms. Meghan [name] ancestry is Caucasian - NOS. There is no known Jewish ancestry and no consanguinity.  Cancer Screening: Ms. Meghan [name] genetic test results are reassuring and indicate that she does not likely have an increased risk of cancer due to a mutation in one of these genes. We discussed undergoing cancer screenings for individuals in the general population, but that she may benefit from tomosynthesis rather than a regular mammogram. This is to be discussed with her physician.  The [employer]  recommends that women follow the breast screening recommendations below, but that these may need to be modified based  on other risk factors such as dense breasts, biopsy history or family history.  Breast awareness - Women should be familiar with their breasts and promptly report changes to their healthcare provider.   Starting at age 66: Breast exam, risk assessment, and risk reduction counseling by the provider and mammogram every year. The provider may discuss screening with tomosynthesis.  Ms. Meghan [name] is also recommended to undergo a yearly gynecologic exam and initiate colon cancer screening at age 77.   Family Members: Additional genetic testing is recommended in Ms. Meghan [name]'s family as such testing might help [place] be even more confident in interpreting Ms. Meghan [name]'s own results. Genetic testing is best initiated in someone who has had cancer.  In this family, her mother would be the most informative person to test.   Any relative who had cancer at a young age or had a particularly rare cancer may also wish to pursue genetic testing. Genetic counselors can be located in other cities, by visiting the website of the [employer] (ArtistMovie.se) and Field seismologist for a Dietitian by zip code.    Lastly, cancer genetics is a rapidly advancing field and it is possible that new genetic tests will be appropriate for Ms. Meghan [name] in the future. We encourage Ms. Meghan [name] to remain in contact with Genetics on an annual basis so her personal and family histories can be updated.    Dr. Grayland Meghan [name] was available for questions concerning this case. Total time spent by counseling by phone was approximately 25 minutes.    Steele Berg, MS, [place] Certified Genetic Counselor phone: [phone]

## 2017-01-11 NOTE — Telephone Encounter (Signed)
Meghan [name] was referred for genetic counseling by Dr. Cindie Crumbly to review results of recent genetic testing she had. I left her messages on 01/08/17 and 01/11/17 to call and schedule this telegenetics visit to be done by phone at her convenience. She has not yet called back.   Meghan [name], [place], [place] Genetic Counselor Phone: [phone]

## 2017-01-13 ENCOUNTER — Telehealth: Payer: Self-pay

## 2017-01-13 NOTE — Telephone Encounter (Signed)
Call patient and left a message we are calling about scheduling her appointment to be seen for generic counseling. No answer or voice mail to leave a message.

## 2017-01-13 NOTE — Telephone Encounter (Signed)
Patient called stating she started bleeding. She is not soaking pads but she is concerned. I advised her she needs to be seen but patient wanted to know your thoughts first. Do you want to do an ultrasound?

## 2017-01-15 ENCOUNTER — Telehealth: Payer: Self-pay

## 2017-01-15 ENCOUNTER — Telehealth: Payer: Self-pay | Admitting: Radiology

## 2017-01-15 DIAGNOSIS — N938 Other specified abnormal uterine and vaginal bleeding: Secondary | ICD-10-CM

## 2017-01-15 NOTE — Telephone Encounter (Signed)
Patient called in stating she is having some abnormal bleeding for the last 12 days. She is not soaking pads but she is concern and would like an ultrasound before scheduling her appointment to be seen. Per Dr.Dove patient can have ultrasound before appointment in our office.

## 2017-01-15 NOTE — Telephone Encounter (Signed)
Left message on the cell pone voicemail with appointment for [place] @ [facility] 01/20/17 @ 1:00, arrival time at 12:45. Provided phone number to reschedule if needed

## 2017-01-19 ENCOUNTER — Ambulatory Visit: Payer: Managed Care, Other (non HMO) | Admitting: Physician Assistant

## 2017-01-19 ENCOUNTER — Encounter: Payer: Self-pay | Admitting: Physician Assistant

## 2017-01-19 VITALS — BP 124/78 | HR 96 | Temp 99.3°F | Resp 16 | Wt 208.0 lb

## 2017-01-19 DIAGNOSIS — J101 Influenza due to other identified influenza virus with other respiratory manifestations: Secondary | ICD-10-CM | POA: Diagnosis not present

## 2017-01-19 LAB — POCT INFLUENZA A/B
Influenza A, POC: POSITIVE — AB
Influenza B, POC: NEGATIVE

## 2017-01-19 MED ORDER — HYDROCODONE-HOMATROPINE 5-1.5 MG/5ML PO SYRP: 5.0000 mL | ORAL_SOLUTION | Freq: Every evening | ORAL | 0 refills | Status: DC | PRN

## 2017-01-19 NOTE — Progress Notes (Signed)
[place]  Chief Complaint  Patient presents with  . URI    Subjective:    Patient ID: Meghan [name], female    DOB: 29-Mar-1970, 47 y.o.   MRN: [id number]  Upper Respiratory Infection: Meghan [name] is a 47 y.o. female with a past medical history significant for current tobacco abuse, rheumatoid arthritis on daily prednisone complaining of symptoms of a URI. Symptoms include congestion, cough, plugged sensation in both ears, sore throat and swollen glands. Onset of symptoms was 1 day ago, gradually worsening since that time. She also c/o bilateral ear pressure/pain, achiness, cough described as nonproductive and nausea without vomiting for the past 1 day .  She is drinking plenty of fluids. Evaluation to date: none. Treatment to date: Aleve. The treatment has provided no.   Review of Systems  Constitutional: Positive for fatigue.  HENT: Positive for congestion, sore throat and trouble swallowing. Negative for ear discharge, ear pain, postnasal drip, rhinorrhea, sinus pressure, sinus pain, tinnitus and voice change.   Eyes: Positive for photophobia. Negative for pain, discharge, redness, itching and visual disturbance.  Respiratory: Positive for cough and chest tightness. Negative for apnea, choking, shortness of breath, wheezing and stridor.   Gastrointestinal: Positive for nausea. Negative for abdominal distention, abdominal pain, anal bleeding, blood in stool, constipation, diarrhea, rectal pain and vomiting.  Musculoskeletal: Positive for myalgias and neck stiffness. Negative for neck pain.  Neurological: Positive for headaches. Negative for dizziness and light-headedness.  Psychiatric/Behavioral: Positive for confusion.       Objective:   BP 124/78 (BP Location: Right Arm, Patient Position: Sitting, Cuff Size: Large)   Pulse 96   Temp 99.3 F (37.4 C) (Oral)   Resp 16   Wt 208 lb (94.3 kg)   SpO2 98%   BMI 33.57 kg/m    Patient Active Problem List   Diagnosis Date Noted  . Heart palpitations 07/10/2015  . Allergic rhinitis 03/04/2015  . Attention deficit disorder 03/04/2015  . Chronic bronchitis ([place]) 03/04/2015  . Acid reflux 03/04/2015  . Panic attack 03/04/2015  . Tobacco abuse 03/04/2015  . Ovarian cyst, left 04/18/2014  . Fatigue 07/29/2009  . Clinical depression 12/26/2007  . Family history of cardiovascular disease 12/26/2007  . Family history of breast cancer 12/26/2007  . Polycythemia, secondary 12/26/2007    Outpatient Encounter Medications as of 01/19/2017  Medication Sig  . clonazePAM (KLONOPIN) 1 MG tablet Take 1 tablet (1 mg total) by mouth daily.  . cyclobenzaprine (FLEXERIL) 5 MG tablet Take 1 tablet (5 mg total) by mouth 3 (three) times daily as needed for muscle spasms.  . furosemide (LASIX) 20 MG tablet TAKE 1 TABLET (20 MG TOTAL) BY MOUTH DAILY AS NEEDED.  Marland Kitchen meloxicam (MOBIC) 15 MG tablet TAKE 1 TABLET BY MOUTH EVERY DAY  . omeprazole (PRILOSEC) 40 MG capsule Take 1 capsule (40 mg total) by mouth daily.  . predniSONE (DELTASONE) 5 MG tablet Take 5mg  a day  . sucralfate (CARAFATE) 1 g tablet Take 1 tablet (1 g total) by mouth 4 (four) times daily -  with meals and at bedtime.  . methotrexate (RHEUMATREX) 2.5 MG tablet Take 7 tabs (17.5 mg ) once a week  . Vitamin D, Ergocalciferol, (DRISDOL) 50000 units CAPS capsule Take 1 capsule (50,000 Units total) by mouth every 7 (seven) days. (Patient not taking: Reported on 12/17/2016)   No facility-administered encounter medications on file as of 01/19/2017.     Allergies  Allergen Reactions  .  Iodine   . Sulfonamide Derivatives     REACTION: face edema       Physical Exam  Constitutional: She is oriented to person, place, and time. She appears well-developed and well-nourished. She appears ill.  HENT:  Right Ear: External ear normal.  Left Ear: External ear normal.  Mouth/Throat: Posterior oropharyngeal edema and posterior  oropharyngeal erythema present. No oropharyngeal exudate.  Cardiovascular: Normal rate and regular rhythm.  Pulmonary/Chest: Effort normal and breath sounds normal.  Lymphadenopathy:    She has cervical adenopathy.  Neurological: She is alert and oriented to person, place, and time.  Skin: Skin is warm and dry.  Psychiatric: She has a normal mood and affect. Her behavior is normal.       Assessment & Plan:  1. Influenza A  Rapid flu positive for flu A. Counseled on course and duration of flu, consider herself contagious for 5-7 days. Note out from work provided. Recommended Delsym or Tessalon perles for cough, she specifically requests hycodan. She declines Tamiflu.   - HYDROcodone-homatropine (HYCODAN) 5-1.5 MG/5ML syrup; Take 5 mLs by mouth at bedtime as needed for cough.  Dispense: 120 mL; Refill: 0 - POCT Influenza A/B  Return if symptoms worsen or fail to improve.  The entirety of the information documented in the History of Present Illness, Review of Systems and Physical Exam were personally obtained by me. Portions of this information were initially documented by Ashley Royalty, CMA and reviewed by me for thoroughness and accuracy.

## 2017-01-19 NOTE — Patient Instructions (Signed)

## 2017-01-20 ENCOUNTER — Ambulatory Visit (HOSPITAL_COMMUNITY): Payer: Managed Care, Other (non HMO)

## 2017-01-26 ENCOUNTER — Telehealth: Payer: Self-pay | Admitting: Physician Assistant

## 2017-01-26 DIAGNOSIS — B3731 Acute candidiasis of vulva and vagina: Secondary | ICD-10-CM

## 2017-01-26 DIAGNOSIS — J329 Chronic sinusitis, unspecified: Secondary | ICD-10-CM

## 2017-01-26 DIAGNOSIS — B373 Candidiasis of vulva and vagina: Secondary | ICD-10-CM

## 2017-01-26 MED ORDER — FLUCONAZOLE 150 MG PO TABS: 150.0000 mg | ORAL_TABLET | Freq: Once | ORAL | 0 refills | Status: AC

## 2017-01-26 MED ORDER — AMOXICILLIN-POT CLAVULANATE 875-125 MG PO TABS
1.0000 | ORAL_TABLET | Freq: Two times a day (BID) | ORAL | 0 refills | Status: AC
Start: 2017-01-26 — End: 2017-02-02

## 2017-01-26 NOTE — Telephone Encounter (Signed)
Sent in Augmentin and diflucan. Any further worsening will need an office visit.

## 2017-01-26 NOTE — Telephone Encounter (Signed)
Left message advising pt.   Thanks,   -Junella Domke  

## 2017-01-26 NOTE — Telephone Encounter (Signed)
Patient states that she saw you last week and had the flu.  She states that she is feeling better and back at work but now feels like she has a sinus from the aftermath of the flu.  She states that she has sinus pressure in forehead and under eyes.  She blew out blood this morning.  She has tried OTC medications and it is not helping at all.  She states if you do send in a antibiotic for her that she will also need a couple of diflucan also.  [facility] uses [facility].

## 2017-02-15 ENCOUNTER — Telehealth: Payer: Self-pay

## 2017-02-15 NOTE — Telephone Encounter (Signed)
Patient is requesting a order for a CXR. She was diagnosed with Influenza A on 01/19/17 and is still coughing really bad, unable to sleep, and urinating on herself from coughing so bad.  She would like the CXR to r/o pneumonia. CB# [phone] if she doesn't answer leave a voicemail. Patient is currently at [facility] hospital with her daughter.

## 2017-02-15 NOTE — Telephone Encounter (Signed)
For your consideration

## 2017-02-15 NOTE — Telephone Encounter (Signed)
Needs office visit to be reassessed before ordering any imaging.

## 2017-02-17 ENCOUNTER — Encounter: Payer: Self-pay | Admitting: Physician Assistant

## 2017-02-17 DIAGNOSIS — B3731 Acute candidiasis of vulva and vagina: Secondary | ICD-10-CM

## 2017-02-17 DIAGNOSIS — B373 Candidiasis of vulva and vagina: Secondary | ICD-10-CM

## 2017-02-18 ENCOUNTER — Telehealth: Payer: Self-pay

## 2017-02-18 MED ORDER — FLUCONAZOLE 150 MG PO TABS: ORAL_TABLET | ORAL | 0 refills | Status: DC

## 2017-02-18 NOTE — Telephone Encounter (Signed)
[facility] Needs office visit if symptoms do not improve after treatment.

## 2017-02-22 NOTE — Telephone Encounter (Signed)
Opened in error

## 2017-03-17 ENCOUNTER — Other Ambulatory Visit: Payer: Self-pay | Admitting: Physician Assistant

## 2017-03-17 DIAGNOSIS — F411 Generalized anxiety disorder: Secondary | ICD-10-CM

## 2017-03-18 ENCOUNTER — Telehealth: Payer: Self-pay

## 2017-03-18 NOTE — Telephone Encounter (Signed)
Prescription for Clonazepam put up (mail) to go out to [facility].  Thanks,  -Tiye Huwe

## 2017-03-24 ENCOUNTER — Other Ambulatory Visit: Payer: Self-pay | Admitting: Physician Assistant

## 2017-03-24 DIAGNOSIS — F411 Generalized anxiety disorder: Secondary | ICD-10-CM

## 2017-03-24 NOTE — Telephone Encounter (Signed)
Pt contacted office for refill request on the following medications:  clonazePAM (KLONOPIN) 1 MG tablet  [facility] [address]   The Rx that was approved on 03/18/17 appears that it was mailed to [facility]. Pt stated that she doesn't use [facility] for this medication and the refill request was sent by [facility]. Pt is requesting this medication to be e-scribed or called into [facility] today because she is completely out of the medication. Please advise. Thanks TNP

## 2017-03-30 DIAGNOSIS — M5030 Other cervical disc degeneration, unspecified cervical region: Secondary | ICD-10-CM | POA: Insufficient documentation

## 2017-04-21 DIAGNOSIS — F419 Anxiety disorder, unspecified: Secondary | ICD-10-CM | POA: Insufficient documentation

## 2017-04-22 ENCOUNTER — Telehealth: Payer: Self-pay | Admitting: Physician Assistant

## 2017-04-22 DIAGNOSIS — M059 Rheumatoid arthritis with rheumatoid factor, unspecified: Secondary | ICD-10-CM

## 2017-04-22 NOTE — Telephone Encounter (Signed)
Referral placed.

## 2017-04-22 NOTE — Telephone Encounter (Signed)
Patient is not happy with rheumatologist at [facility] that she sees.  She wants to be referred to Dr. Berenda Morale at [facility] please.  Can you make a referral?

## 2017-09-27 ENCOUNTER — Ambulatory Visit: Payer: Managed Care, Other (non HMO) | Admitting: Physician Assistant

## 2017-09-27 ENCOUNTER — Encounter: Payer: Self-pay | Admitting: Physician Assistant

## 2017-09-27 VITALS — BP 120/70 | HR 80 | Temp 97.9°F | Resp 16 | Ht 66.0 in | Wt 194.8 lb

## 2017-09-27 DIAGNOSIS — Z716 Tobacco abuse counseling: Secondary | ICD-10-CM | POA: Diagnosis not present

## 2017-09-27 DIAGNOSIS — Z6831 Body mass index (BMI) 31.0-31.9, adult: Secondary | ICD-10-CM

## 2017-09-27 DIAGNOSIS — F411 Generalized anxiety disorder: Secondary | ICD-10-CM

## 2017-09-27 MED ORDER — VARENICLINE TARTRATE 0.5 MG X 11 & 1 MG X 42 PO MISC: ORAL | 0 refills | Status: DC

## 2017-09-27 MED ORDER — CLONAZEPAM 1 MG PO TABS: ORAL_TABLET | ORAL | 5 refills | Status: DC

## 2017-09-27 NOTE — Progress Notes (Signed)
Patient: Meghan [name] Female    DOB: 1970/07/14   47 y.o.   MRN: [id number] Visit Date: 09/27/2017  Today's Provider: Mar Daring, PA-C   Chief Complaint  Patient presents with  . Biometric Form   Subjective:    HPI Patient here today for her biometric screening form to be fill out. She was overweight with BMI 32. She is now down to a BMI 31 and has lost 14 pounds over the last few months. She has been moving more and trying to decrease portion sizes as well.   Also has not been able to quit smoking. She has requested Chantix in the past but reports it was over $400 and she could not afford that. She has quit successfully in the past but relapsed this summer. She reports she previously quit cold [place]. She reports boredom as the trigger for restarting.      Allergies  Allergen Reactions  . Sulfamethoxazole-Trimethoprim     Other reaction(s): Other  . Iodine   . Sulfonamide Derivatives     REACTION: face edema     Current Outpatient Medications:  .  clonazePAM (KLONOPIN) 1 MG tablet, TAKE 1 TABLET BY MOUTH EVERY DAY (*NOT COVER-MAIL ORDER REQUIRED), Disp: 30 tablet, Rfl: 5 .  omeprazole (PRILOSEC) 40 MG capsule, Take 1 capsule (40 mg total) by mouth daily., Disp: 90 capsule, Rfl: 1 .  cyclobenzaprine (FLEXERIL) 5 MG tablet, Take 1 tablet (5 mg total) by mouth 3 (three) times daily as needed for muscle spasms. (Patient not taking: Reported on 09/27/2017), Disp: 30 tablet, Rfl: 1 .  fluconazole (DIFLUCAN) 150 MG tablet, Take one tablet on day 1. May take 2nd tablet three days later if symptoms persist. (Patient not taking: Reported on 09/27/2017), Disp: 2 tablet, Rfl: 0 .  furosemide (LASIX) 20 MG tablet, TAKE 1 TABLET (20 MG TOTAL) BY MOUTH DAILY AS NEEDED. (Patient not taking: Reported on 09/27/2017), Disp: 30 tablet, Rfl: 0 .  HYDROcodone-homatropine (HYCODAN) 5-1.5 MG/5ML syrup, Take 5 mLs by mouth at bedtime as needed for cough. (Patient not taking: Reported on  09/27/2017), Disp: 120 mL, Rfl: 0 .  meloxicam (MOBIC) 15 MG tablet, TAKE 1 TABLET BY MOUTH EVERY DAY (Patient not taking: Reported on 09/27/2017), Disp: 30 tablet, Rfl: 0 .  methotrexate (RHEUMATREX) 2.5 MG tablet, Take 7 tabs (17.5 mg ) once a week, Disp: , Rfl:  .  predniSONE (DELTASONE) 5 MG tablet, Take 5mg  a day, Disp: , Rfl:  .  sucralfate (CARAFATE) 1 g tablet, Take 1 tablet (1 g total) by mouth 4 (four) times daily -  with meals and at bedtime. (Patient not taking: Reported on 09/27/2017), Disp: 360 tablet, Rfl: 1 .  Vitamin D, Ergocalciferol, (DRISDOL) 50000 units CAPS capsule, Take 1 capsule (50,000 Units total) by mouth every 7 (seven) days. (Patient not taking: Reported on 12/17/2016), Disp: 15 capsule, Rfl: 0  Review of Systems  Constitutional: Negative.   Respiratory: Negative.   Cardiovascular: Negative.   Genitourinary: Negative.   Neurological: Negative.   Psychiatric/Behavioral: Negative.     Social History   Tobacco Use  . Smoking status: Current Every Day Smoker    Packs/day: 0.50    Types: Cigarettes    Last attempt to quit: 01/04/2010    Years since quitting: 7.7  . Smokeless tobacco: Never Used  Substance Use Topics  . Alcohol use: Yes    Comment: social   Objective:   BP 120/70 (BP Location: Left  Arm, Patient Position: Sitting, Cuff Size: Normal)   Pulse 80   Temp 97.9 F (36.6 C) (Oral)   Resp 16   Ht 5\' 6"  (1.676 m)   Wt 194 lb 12.8 oz (88.4 kg)   BMI 31.44 kg/m  Vitals:   09/27/17 1611  BP: 120/70  Pulse: 80  Resp: 16  Temp: 97.9 F (36.6 C)  TempSrc: Oral  Weight: 194 lb 12.8 oz (88.4 kg)  Height: 5\' 6"  (1.676 m)     Physical Exam  Constitutional: She appears well-developed and well-nourished. No distress.  HENT:  Head: Normocephalic and atraumatic.  Neck: Normal range of motion. Neck supple.  Cardiovascular: Normal rate, regular rhythm and normal heart sounds. Exam reveals no gallop and no friction rub.  No murmur  heard. Pulmonary/Chest: Effort normal and breath sounds normal. No respiratory distress. She has no wheezes. She has no rales.  Skin: She is not diaphoretic.  Psychiatric: She has a normal mood and affect. Her behavior is normal. Judgment and thought content normal.  Vitals reviewed.       Assessment & Plan:     1. BMI 31.0-31.9,adult Continue with portion control and increasing physical activity as she has already lost 14 pounds.   2. Encounter for smoking cessation counseling Will try to see if Chantix can be more affordable this year. Savings card given to patient as well. Printed [place] for patient.  - varenicline (CHANTIX PAK) 0.5 MG X 11 & 1 MG X 42 tablet; Take one 0.5 mg tab PO once daily for 3 days, then increase to one 0.5 mg tablet twice daily for 4 days, then increase to one 1 mg tab BID.  Dispense: 53 tablet; Refill: 0  3. GAD (generalized anxiety disorder) Stable. Diagnosis pulled for medication refill. Continue current medical treatment plan. - clonazePAM (KLONOPIN) 1 MG tablet; TAKE 1 TABLET BY MOUTH EVERY DAY (*NOT COVER-MAIL ORDER REQUIRED)  Dispense: 30 tablet; Refill: [place], PA-C  [facility]

## 2017-09-28 ENCOUNTER — Encounter: Payer: Self-pay | Admitting: Physician Assistant

## 2017-10-12 ENCOUNTER — Encounter: Payer: Self-pay | Admitting: Physician Assistant

## 2017-11-06 ENCOUNTER — Other Ambulatory Visit: Payer: Self-pay | Admitting: Physician Assistant

## 2017-11-06 DIAGNOSIS — Z716 Tobacco abuse counseling: Secondary | ICD-10-CM

## 2017-11-23 ENCOUNTER — Other Ambulatory Visit: Payer: Self-pay | Admitting: Physician Assistant

## 2017-11-23 ENCOUNTER — Encounter: Payer: Self-pay | Admitting: Radiology

## 2017-11-23 DIAGNOSIS — Z1231 Encounter for screening mammogram for malignant neoplasm of breast: Secondary | ICD-10-CM

## 2017-12-28 ENCOUNTER — Ambulatory Visit
Admission: RE | Admit: 2017-12-28 | Discharge: 2017-12-28 | Disposition: A | Discharge status: Home / Self Care | Payer: Managed Care, Other (non HMO) | Source: Ambulatory Visit | Attending: Physician Assistant | Admitting: Physician Assistant

## 2017-12-28 DIAGNOSIS — Z1231 Encounter for screening mammogram for malignant neoplasm of breast: Secondary | ICD-10-CM | POA: Diagnosis present

## 2018-03-30 ENCOUNTER — Other Ambulatory Visit: Payer: Self-pay

## 2018-03-30 ENCOUNTER — Encounter (HOSPITAL_BASED_OUTPATIENT_CLINIC_OR_DEPARTMENT_OTHER): Payer: Self-pay | Admitting: *Deleted

## 2018-03-30 NOTE — Progress Notes (Signed)
Spoke with patient via telephone for pre op interview. NPO after MN. Patient to take Prilosec and Klonopin PRN. Arrival time 1015.

## 2018-04-08 ENCOUNTER — Ambulatory Visit (HOSPITAL_BASED_OUTPATIENT_CLINIC_OR_DEPARTMENT_OTHER)
Admission: RE | Admit: 2018-04-08 | Payer: Managed Care, Other (non HMO) | Source: Home / Self Care | Admitting: Obstetrics

## 2018-04-08 ENCOUNTER — Other Ambulatory Visit: Payer: Self-pay | Admitting: Physician Assistant

## 2018-04-08 HISTORY — DX: Gastro-esophageal reflux disease without esophagitis: K21.9

## 2018-04-08 SURGERY — DILATATION AND CURETTAGE /HYSTEROSCOPY
Anesthesia: Choice

## 2018-04-08 NOTE — Telephone Encounter (Signed)
Opened in error

## 2018-06-15 ENCOUNTER — Other Ambulatory Visit: Payer: Self-pay | Admitting: Physician Assistant

## 2018-06-15 DIAGNOSIS — F411 Generalized anxiety disorder: Secondary | ICD-10-CM

## 2018-06-17 ENCOUNTER — Encounter (HOSPITAL_BASED_OUTPATIENT_CLINIC_OR_DEPARTMENT_OTHER): Payer: Self-pay | Admitting: *Deleted

## 2018-06-17 ENCOUNTER — Other Ambulatory Visit: Payer: Self-pay

## 2018-06-17 NOTE — Progress Notes (Signed)
Spoke w/ pt via phone for pre-op interview.  Npo after mn.  Arrive at [facility].  Getting lab work (cbc, t&s) and covid test done Monday 06-20-2018 @ 1445.  Will take klonopin and prilosec am dos w/ sips of water.

## 2018-06-17 NOTE — Progress Notes (Signed)
SPOKE W/  _ pt via phone     [place] 19:   COUGH-- no  RUNNY NOSE--- no  SORE THROAT--- no  NASAL CONGESTION---- no  SNEEZING---- no  SHORTNESS OF BREATH--- no  DIFFICULTY BREATHING--- no  TEMP >100.0 ----- no  UNEXPLAINED BODY ACHES------ no  CHILLS --------  no  HEADACHES --------- no  LOSS OF SMELL/ TASTE -------- no    HAVE YOU OR ANY FAMILY MEMBER TRAVELLED PAST 14 DAYS OUT OF THE   COUNTY--- per pt husband travels for work around [employer] and [employer]---- COUNTRY----  no  HAVE YOU OR ANY FAMILY MEMBER BEEN EXPOSED TO ANYONE WITH COVID 19?   denies

## 2018-06-20 ENCOUNTER — Other Ambulatory Visit (HOSPITAL_COMMUNITY)
Admission: RE | Admit: 2018-06-20 | Discharge: 2018-06-20 | Disposition: A | Discharge status: Home / Self Care | Payer: Managed Care, Other (non HMO) | Source: Ambulatory Visit | Attending: Obstetrics | Admitting: Obstetrics

## 2018-06-20 ENCOUNTER — Other Ambulatory Visit: Payer: Self-pay

## 2018-06-20 ENCOUNTER — Encounter (HOSPITAL_COMMUNITY)
Admission: RE | Admit: 2018-06-20 | Discharge: 2018-06-20 | Disposition: A | Discharge status: Home / Self Care | Payer: Managed Care, Other (non HMO) | Source: Ambulatory Visit | Attending: Obstetrics | Admitting: Obstetrics

## 2018-06-20 DIAGNOSIS — N950 Postmenopausal bleeding: Secondary | ICD-10-CM | POA: Diagnosis present

## 2018-06-20 DIAGNOSIS — K219 Gastro-esophageal reflux disease without esophagitis: Secondary | ICD-10-CM | POA: Diagnosis not present

## 2018-06-20 DIAGNOSIS — N736 Female pelvic peritoneal adhesions (postinfective): Secondary | ICD-10-CM | POA: Diagnosis not present

## 2018-06-20 DIAGNOSIS — Z1159 Encounter for screening for other viral diseases: Secondary | ICD-10-CM | POA: Diagnosis not present

## 2018-06-20 DIAGNOSIS — F17210 Nicotine dependence, cigarettes, uncomplicated: Secondary | ICD-10-CM | POA: Diagnosis not present

## 2018-06-20 DIAGNOSIS — Z01812 Encounter for preprocedural laboratory examination: Secondary | ICD-10-CM | POA: Insufficient documentation

## 2018-06-20 DIAGNOSIS — N858 Other specified noninflammatory disorders of uterus: Secondary | ICD-10-CM | POA: Diagnosis not present

## 2018-06-20 LAB — SARS CORONAVIRUS 2 BY RT PCR (HOSPITAL ORDER, PERFORMED IN ~~LOC~~ HOSPITAL LAB): SARS Coronavirus 2: NEGATIVE

## 2018-06-20 LAB — CBC
HCT: 41.5 % (ref 36.0–46.0)
Hemoglobin: 13.8 g/dL (ref 12.0–15.0)
MCH: 29.1 pg (ref 26.0–34.0)
MCHC: 33.3 g/dL (ref 30.0–36.0)
MCV: 87.4 fL (ref 80.0–100.0)
Platelets: 237 10*3/uL (ref 150–400)
RBC: 4.75 MIL/uL (ref 3.87–5.11)
RDW: 13.1 % (ref 11.5–15.5)
WBC: 5.5 10*3/uL (ref 4.0–10.5)
nRBC: 0 % (ref 0.0–0.2)

## 2018-06-21 LAB — ABO/RH: ABO/RH(D): B POS

## 2018-06-21 NOTE — Anesthesia Preprocedure Evaluation (Addendum)
Anesthesia Evaluation  Patient identified by MRN, date of birth, ID band Patient awake    Reviewed: Allergy & Precautions, NPO status , Patient's Chart, lab work & pertinent test results  History of Anesthesia Complications Negative for: history of anesthetic complications  Airway Mallampati: II  TM Distance: >3 FB Neck ROM: Full    Dental no notable dental hx.    Pulmonary Current Smoker,    Pulmonary exam normal        Cardiovascular negative cardio ROS Normal cardiovascular exam     Neuro/Psych Anxiety Depression negative neurological ROS     GI/Hepatic Neg liver ROS, GERD  Medicated and Controlled,  Endo/Other  negative endocrine ROS  Renal/GU negative Renal ROS     Musculoskeletal  (+) Arthritis , Rheumatoid disorders,    Abdominal   Peds  Hematology negative hematology ROS (+)   Anesthesia Other Findings Day of surgery medications reviewed with the patient.  Reproductive/Obstetrics                           Anesthesia Physical Anesthesia Plan  ASA: II  Anesthesia Plan: General   Post-op Pain Management:    Induction: Intravenous  PONV Risk Score and Plan: 2 and Treatment may vary due to age or medical condition, Ondansetron, Dexamethasone and Midazolam  Airway Management Planned: LMA  Additional Equipment:   Intra-op Plan:   Post-operative Plan: Extubation in OR  Informed Consent: I have reviewed the patients History and Physical, chart, labs and discussed the procedure including the risks, benefits and alternatives for the proposed anesthesia with the patient or authorized representative who has indicated his/her understanding and acceptance.     Dental advisory given  Plan Discussed with: CRNA  Anesthesia Plan Comments:        Anesthesia Quick Evaluation

## 2018-06-21 NOTE — Progress Notes (Signed)
SPOKE W/  _     SCREENING SYMPTOMS OF COVID 19:   COUGH--  RUNNY NOSE---   SORE THROAT---  NASAL CONGESTION----  SNEEZING----  SHORTNESS OF BREATH---  DIFFICULTY BREATHING---  TEMP >100.0 -----  UNEXPLAINED BODY ACHES------  CHILLS --------   HEADACHES ---------  LOSS OF SMELL/ TASTE --------    HAVE YOU OR ANY FAMILY MEMBER TRAVELLED PAST 14 DAYS OUT OF THE   COUNTY--- STATE---- COUNTRY----  HAVE YOU OR ANY FAMILY MEMBER BEEN EXPOSED TO ANYONE WITH COVID 19?    Voice mail answered by a female not listing Olanna's name. No message left.

## 2018-06-22 ENCOUNTER — Ambulatory Visit (HOSPITAL_BASED_OUTPATIENT_CLINIC_OR_DEPARTMENT_OTHER): Payer: Managed Care, Other (non HMO) | Admitting: Anesthesiology

## 2018-06-22 ENCOUNTER — Ambulatory Visit (HOSPITAL_BASED_OUTPATIENT_CLINIC_OR_DEPARTMENT_OTHER)
Admission: RE | Admit: 2018-06-22 | Discharge: 2018-06-22 | Disposition: A | Discharge status: Home / Self Care | Payer: Managed Care, Other (non HMO) | Attending: Obstetrics | Admitting: Obstetrics

## 2018-06-22 ENCOUNTER — Encounter (HOSPITAL_BASED_OUTPATIENT_CLINIC_OR_DEPARTMENT_OTHER)
Admission: RE | Disposition: A | Discharge status: Home / Self Care | Payer: Self-pay | Source: Home / Self Care | Attending: Obstetrics

## 2018-06-22 ENCOUNTER — Encounter (HOSPITAL_BASED_OUTPATIENT_CLINIC_OR_DEPARTMENT_OTHER): Payer: Self-pay | Admitting: Obstetrics

## 2018-06-22 DIAGNOSIS — Z1159 Encounter for screening for other viral diseases: Secondary | ICD-10-CM | POA: Insufficient documentation

## 2018-06-22 DIAGNOSIS — N736 Female pelvic peritoneal adhesions (postinfective): Secondary | ICD-10-CM | POA: Insufficient documentation

## 2018-06-22 DIAGNOSIS — Z9889 Other specified postprocedural states: Secondary | ICD-10-CM

## 2018-06-22 DIAGNOSIS — N950 Postmenopausal bleeding: Secondary | ICD-10-CM | POA: Diagnosis not present

## 2018-06-22 DIAGNOSIS — K219 Gastro-esophageal reflux disease without esophagitis: Secondary | ICD-10-CM | POA: Insufficient documentation

## 2018-06-22 DIAGNOSIS — F17210 Nicotine dependence, cigarettes, uncomplicated: Secondary | ICD-10-CM | POA: Insufficient documentation

## 2018-06-22 DIAGNOSIS — N858 Other specified noninflammatory disorders of uterus: Secondary | ICD-10-CM | POA: Insufficient documentation

## 2018-06-22 HISTORY — PX: HYSTEROSCOPY W/D&C: SHX1775

## 2018-06-22 HISTORY — DX: Postmenopausal bleeding: N95.0

## 2018-06-22 HISTORY — DX: Rheumatoid arthritis, unspecified: M06.9

## 2018-06-22 LAB — TYPE AND SCREEN
ABO/RH(D): B POS
Antibody Screen: NEGATIVE

## 2018-06-22 SURGERY — DILATATION AND CURETTAGE /HYSTEROSCOPY
Anesthesia: General

## 2018-06-22 MED ORDER — KETOROLAC TROMETHAMINE 30 MG/ML IJ SOLN
INTRAMUSCULAR | Status: AC
  Filled 2018-06-22: qty 1

## 2018-06-22 MED ORDER — PROMETHAZINE HCL 25 MG/ML IJ SOLN
6.2500 mg | INTRAMUSCULAR | Status: DC | PRN
  Filled 2018-06-22: qty 1

## 2018-06-22 MED ORDER — FENTANYL CITRATE (PF) 100 MCG/2ML IJ SOLN
INTRAMUSCULAR | Status: AC
  Filled 2018-06-22: qty 2

## 2018-06-22 MED ORDER — KETOROLAC TROMETHAMINE 30 MG/ML IJ SOLN
INTRAMUSCULAR | Status: DC | PRN
  Administered 2018-06-22: 30 mg via INTRAVENOUS

## 2018-06-22 MED ORDER — OXYCODONE HCL 5 MG PO TABS: 5.0000 mg | ORAL_TABLET | Freq: Four times a day (QID) | ORAL | 0 refills | Status: DC | PRN

## 2018-06-22 MED ORDER — PROPOFOL 10 MG/ML IV BOLUS
INTRAVENOUS | Status: AC
  Filled 2018-06-22: qty 40

## 2018-06-22 MED ORDER — LIDOCAINE 2% (20 MG/ML) 5 ML SYRINGE
INTRAMUSCULAR | Status: AC
  Filled 2018-06-22: qty 5

## 2018-06-22 MED ORDER — PROPOFOL 10 MG/ML IV BOLUS
INTRAVENOUS | Status: DC | PRN
  Administered 2018-06-22: 170 mg via INTRAVENOUS

## 2018-06-22 MED ORDER — OXYCODONE HCL 5 MG/5ML PO SOLN
5.0000 mg | Freq: Once | ORAL | Status: AC | PRN
  Filled 2018-06-22: qty 5

## 2018-06-22 MED ORDER — FENTANYL CITRATE (PF) 100 MCG/2ML IJ SOLN
25.0000 ug | INTRAMUSCULAR | Status: DC | PRN
  Administered 2018-06-22 (×2): 50 ug via INTRAVENOUS
  Filled 2018-06-22: qty 1

## 2018-06-22 MED ORDER — LACTATED RINGERS IV SOLN
INTRAVENOUS | Status: DC
  Filled 2018-06-22: qty 1000

## 2018-06-22 MED ORDER — ONDANSETRON HCL 4 MG/2ML IJ SOLN
INTRAMUSCULAR | Status: AC
  Filled 2018-06-22: qty 4

## 2018-06-22 MED ORDER — ACETAMINOPHEN 10 MG/ML IV SOLN
1000.0000 mg | Freq: Once | INTRAVENOUS | Status: DC | PRN
  Filled 2018-06-22: qty 100

## 2018-06-22 MED ORDER — OXYCODONE HCL 5 MG PO TABS
ORAL_TABLET | ORAL | Status: AC
  Filled 2018-06-22: qty 1

## 2018-06-22 MED ORDER — SODIUM CHLORIDE 0.9 % IR SOLN
Status: DC | PRN
  Administered 2018-06-22: 3000 mL

## 2018-06-22 MED ORDER — ONDANSETRON HCL 4 MG/2ML IJ SOLN
INTRAMUSCULAR | Status: DC | PRN
  Administered 2018-06-22 (×2): 4 mg via INTRAVENOUS

## 2018-06-22 MED ORDER — ACETAMINOPHEN 500 MG PO TABS
ORAL_TABLET | ORAL | Status: AC
  Filled 2018-06-22: qty 2

## 2018-06-22 MED ORDER — LACTATED RINGERS IV SOLN
INTRAVENOUS | Status: DC
  Administered 2018-06-22 (×2): via INTRAVENOUS
  Filled 2018-06-22: qty 1000

## 2018-06-22 MED ORDER — MIDAZOLAM HCL 2 MG/2ML IJ SOLN
INTRAMUSCULAR | Status: AC
  Filled 2018-06-22: qty 2

## 2018-06-22 MED ORDER — FENTANYL CITRATE (PF) 100 MCG/2ML IJ SOLN
INTRAMUSCULAR | Status: DC | PRN
  Administered 2018-06-22 (×2): 50 ug via INTRAVENOUS

## 2018-06-22 MED ORDER — DEXAMETHASONE SODIUM PHOSPHATE 10 MG/ML IJ SOLN
INTRAMUSCULAR | Status: DC | PRN
  Administered 2018-06-22: 10 mg via INTRAVENOUS

## 2018-06-22 MED ORDER — OXYCODONE HCL 5 MG PO TABS
5.0000 mg | ORAL_TABLET | Freq: Once | ORAL | Status: AC | PRN
  Administered 2018-06-22: 5 mg via ORAL
  Filled 2018-06-22: qty 1

## 2018-06-22 MED ORDER — LIDOCAINE HCL 1 % IJ SOLN
INTRAMUSCULAR | Status: DC | PRN
  Administered 2018-06-22: 10 mL

## 2018-06-22 MED ORDER — SCOPOLAMINE 1 MG/3DAYS TD PT72
MEDICATED_PATCH | TRANSDERMAL | Status: AC
  Filled 2018-06-22: qty 1

## 2018-06-22 MED ORDER — ACETAMINOPHEN 500 MG PO TABS
1000.0000 mg | ORAL_TABLET | Freq: Once | ORAL | Status: AC
  Administered 2018-06-22: 1000 mg via ORAL
  Filled 2018-06-22: qty 2

## 2018-06-22 MED ORDER — MIDAZOLAM HCL 5 MG/5ML IJ SOLN
INTRAMUSCULAR | Status: DC | PRN
  Administered 2018-06-22: 2 mg via INTRAVENOUS

## 2018-06-22 MED ORDER — DEXAMETHASONE SODIUM PHOSPHATE 10 MG/ML IJ SOLN
INTRAMUSCULAR | Status: AC
  Filled 2018-06-22: qty 1

## 2018-06-22 MED ORDER — SCOPOLAMINE 1 MG/3DAYS TD PT72
1.0000 | MEDICATED_PATCH | Freq: Once | TRANSDERMAL | Status: DC
  Administered 2018-06-22: 1.5 mg via TRANSDERMAL
  Filled 2018-06-22: qty 1

## 2018-06-22 MED ORDER — LIDOCAINE 2% (20 MG/ML) 5 ML SYRINGE
INTRAMUSCULAR | Status: DC | PRN
  Administered 2018-06-22: 100 mg via INTRAVENOUS

## 2018-06-22 SURGICAL SUPPLY — 19 items
CATH ROBINSON RED A/P 16FR (CATHETERS) ×3 IMPLANT
CONTAINER PREFILL 10% NBF 60ML (FORM) ×6 IMPLANT
COVER WAND RF STERILE (DRAPES) ×3 IMPLANT
GAUZE 4X4 16PLY RFD (DISPOSABLE) ×3 IMPLANT
GLOVE BIOGEL PI IND STRL 6.5 (GLOVE) ×1 IMPLANT
GLOVE BIOGEL PI IND STRL 7.0 (GLOVE) ×1 IMPLANT
GLOVE BIOGEL PI IND STRL 7.5 (GLOVE) ×1 IMPLANT
GLOVE BIOGEL PI INDICATOR 6.5 (GLOVE) ×2
GLOVE BIOGEL PI INDICATOR 7.0 (GLOVE) ×2
GLOVE BIOGEL PI INDICATOR 7.5 (GLOVE) ×2
GLOVE ECLIPSE 6.0 STRL STRAW (GLOVE) ×3 IMPLANT
GLOVE SURG SS PI 7.5 STRL IVOR (GLOVE) ×3 IMPLANT
GOWN STRL REUS W/TWL LRG LVL3 (GOWN DISPOSABLE) ×6 IMPLANT
IV NS IRRIG 3000ML ARTHROMATIC (IV SOLUTION) ×3 IMPLANT
KIT PROCEDURE FLUENT (KITS) ×3 IMPLANT
PACK VAGINAL MINOR WOMEN LF (CUSTOM PROCEDURE TRAY) ×3 IMPLANT
PAD OB MATERNITY 4.3X12.25 (PERSONAL CARE ITEMS) ×3 IMPLANT
TOWEL OR 17X26 10 PK STRL BLUE (TOWEL DISPOSABLE) ×3 IMPLANT
WATER STERILE IRR 500ML POUR (IV SOLUTION) ×3 IMPLANT

## 2018-06-22 NOTE — H&P (Addendum)
48 y.o. G2P2 presents for hysteroscopy, D&C for recurrent postmenopausal bleeding.   She had an endometrial ablation 15 years ago. Prior to ablation had heavy cycles q2-4 weeks. Following ablation, did not have amenorrhea --menses just lightened to once monthly. Reports definitive menopause in early 24s. Had an episode of heavy bleeding 3 years ago. No work up. No bleeding from Nov 2015 to 12/2016. Had a 3wk period (heavy x2 weeks, light flow last week) the end of 12/2016.   She was seen initially for PMB in May 2019.  Endometrial biopsy at that time was negative.  She has continued to have multiple episodes of PMB in the interim.   Ultrasound at that time was unremarkable.  Surgery was ultimately delayed multiple times.  Given delay, EMB was repeated in May 2020 and was normal.     Past Medical History:  Diagnosis Date  . Anxiety   . Depression   . Fatigue   . GERD (gastroesophageal reflux disease)   . PMB (postmenopausal bleeding)   . RA (rheumatoid arthritis) ([place])    rheumotologist-- dr [facility])-- seropostivie RA, positive ANA    Past Surgical History:  Procedure Laterality Date  . CHOLECYSTECTOMY OPEN  1987   W/  APPENDECTOMY  . HYSTEROSCOPY W/ ENDOMETRIAL ABLATION  2004   W/  LAPAROSCOPIC BILATERAL TUBAL LIGATION  . LAPAROSCOPIC UNILATERAL SALPINGECTOMY Left 2002   ECTOPIC    OB History  Gravida Para Term Preterm AB Living  2 2          SAB TAB Ectopic Multiple Live Births               # Outcome Date GA Lbr Len/2nd Weight Sex Delivery Anes PTL Lv  2 Para           1 Para             Social History   Socioeconomic History  . Marital status: Married    Spouse name: Not on file  . Number of children: Not on file  . Years of education: Not on file  . Highest education level: Not on file  Tobacco Use  . Smoking status: Current Every Day Smoker    Packs/day: 1.00    Years: 25.00    Pack years: 25.00    Types: Cigarettes  . Smokeless  tobacco: Never Used  Substance and Sexual Activity  . Alcohol use: Yes    Comment: social  . Drug use: Not Currently    Comment: 06-17-2018 per pt tried different drugs as teen but none since  . Sexual activity: Yes    Partners: Male    Birth control/protection: Surgical  Other Topics Concern  . Not on file  Social History Narrative   Married, full time, gets regular exercise.    Contrast media [iodinated diagnostic agents] and Sulfa antibiotics    Vitals:   06/22/18 0642  BP: 120/84  Pulse: 79  Resp: 16  Temp: 97.6 F (36.4 C)  SpO2: 98%     General:  NAD Abdomen:  soft   A/P   48 y.o.  G2P2  presents for hysteroscopy, dilation and curettage for recurrent postmenopausal bleeding.   Discussed risks to include infection, bleeding, damage to surrounding structures (including but not limited to vagina, cervix, bladder, uterus), uterine perforation, need for additional procedures. Discussed potential for further recurrence of bleeding remote from surgery requiring additional evaluation or surgery. All questions answered and patient elects to proceed.   Akaysha Cobern  [employer]

## 2018-06-22 NOTE — Op Note (Signed)
Operative Note  Pre-operative Diagnosis: Recurrent post menopausal bleeding  Post-operative Diagnosis: Recurrent post menopausal bleeding  Surgeon: Jerelyn Charles, MD  Procedure: hysteroscopy, dilation and curettage  Anesthesia: general  Estimated Blood Loss: 71mL         Specimens: endometrial curettings         Findings: Uterus sounded to 7 cm.  Thin, atrophic endometrium.  Small cavity, right tubal ostia visualized.  There was a small tract that appeared to head toward the left, but the mid- to left portion of the cavity was occluded by an intra-uterine adhesion from prior endometrial ablation and entire uterine cavity could not be fully surveyed.    Description of Procedure:         After adequate anesthesia was achieved, the patient placed in the dorsal lithotomy position in [place].  She was prepped and draped in the usual sterile fashion.  The bladder was drained with a catheter.  A bimanual exam revealed an anteverted uterus.  The bivalve speculum was placed in the vagina and the anterior lip of the cervix grasped with a single-tooth tenaculum.  The uterus sounded to 7cm.  The cervix was serially dilated with ease with Hank dilators to 45mm.   Under direct visualization, the hysteroscope was advanced through the cervix into the endometrial cavity.  The uterine cavity was surveyed and findings were as noted above. The endometrium was noted to be thin and atrophic. The cavity was small and the right tubal ostia visualized.  The mid- to left portion of the cavity was occluded by an intra-uterine adhesion from prior endometrial ablation.  There was a small tract that appeared to head toward the left, but this could not be accessed with the hysteroscope and entire uterine cavity could not be fully surveyed.  The hysteroscope was removed.  A sharp curettage was performed with scant tissue returned.  All the vaginal instruments were removed.  Counts were correct.  The patient was awakened  from anesthesia and transferred to PACU in stable condition.    [place], [facility]

## 2018-06-22 NOTE — Discharge Instructions (Signed)
Pelvic rest x 2 weeks (no intercourse or tampons).    Call your doctor if you have heavy vaginal bleeding (soaking through a pad an hour or more for >2 hours in a row), temperature >101F, severe nausea, vomiting, severe or worsening abdominal pain, dizziness, shortness of breath, chest pain or any other concerns.  Please take motrin every 6 hours.  Add tylenol if your pain is not controlled by motrin alone.    Post Anesthesia Home Care Instructions  Activity: Get plenty of rest for the remainder of the day. A responsible individual must stay with you for 24 hours following the procedure.  For the next 24 hours, DO NOT: -Drive a car -Paediatric nurse -Drink alcoholic beverages -Take any medication unless instructed by your physician -Make any legal decisions or sign important papers.  Meals: Start with liquid foods such as gelatin or soup. Progress to regular foods as tolerated. Avoid greasy, spicy, heavy foods. If nausea and/or vomiting occur, drink only clear liquids until the nausea and/or vomiting subsides. Call your physician if vomiting continues.  Special Instructions/Symptoms: Your throat may feel dry or sore from the anesthesia or the breathing tube placed in your throat during surgery. If this causes discomfort, gargle with warm salt water. The discomfort should disappear within 24 hours.  If you had a scopolamine patch placed behind your ear for the management of post- operative nausea and/or vomiting:  1. The medication in the patch is effective for 72 hours, after which it should be removed.  Wrap patch in a tissue and discard in the trash. Wash hands thoroughly with soap and water. 2. You may remove the patch earlier than 72 hours if you experience unpleasant side effects which may include dry mouth, dizziness or visual disturbances. 3. Avoid touching the patch. Wash your hands with soap and water after contact with the patch.  Remove scopolamine patch by Saturday, June 25, 2018.

## 2018-06-22 NOTE — Anesthesia Postprocedure Evaluation (Signed)
Anesthesia Post Note  Patient: Meghan [name]  Procedure(s) Performed: DILATATION AND CURETTAGE /HYSTEROSCOPY (N/A )     Patient location during evaluation: PACU Anesthesia Type: General Level of consciousness: awake and alert Pain management: pain level controlled Vital Signs Assessment: post-procedure vital signs reviewed and stable Respiratory status: spontaneous breathing, nonlabored ventilation and respiratory function stable Cardiovascular status: blood pressure returned to baseline and stable Postop Assessment: no apparent nausea or vomiting Anesthetic complications: no    Last Vitals:  Vitals:   06/22/18 0930 06/22/18 0945  BP: 125/81 118/70  Pulse: 80 75  Resp: 13 13  Temp:    SpO2: 94% 93%    Last Pain:  Vitals:   06/22/18 1000  TempSrc:   PainSc: [place]

## 2018-06-22 NOTE — Progress Notes (Signed)
SPOKE W/  _patient   Meghan [name]     SCREENING SYMPTOMS OF COVID 19:   COUGH--no  RUNNY NOSE---no   SORE THROAT---no  NASAL CONGESTION----no  SNEEZING----no  SHORTNESS OF BREATH---no  DIFFICULTY BREATHING---no  TEMP >100.0 -----no  UNEXPLAINED BODY ACHES------no  CHILLS --------no   HEADACHES ---------no  LOSS OF SMELL/ TASTE --------no    HAVE YOU OR ANY FAMILY MEMBER TRAVELLED PAST 14 DAYS OUT OF THE   COUNTY---husband  For work STATE----husband for work COUNTRY----no  HAVE YOU OR ANY FAMILY MEMBER BEEN EXPOSED TO ANYONE WITH COVID 19? no

## 2018-06-22 NOTE — Anesthesia Procedure Notes (Signed)
Procedure Name: LMA Insertion Date/Time: 06/22/2018 8:30 AM Performed by: Gwyndolyn Saxon, CRNA Pre-anesthesia Checklist: Patient identified, Emergency Drugs available, Suction available and Patient being monitored Patient Re-evaluated:Patient Re-evaluated prior to induction Oxygen Delivery Method: Circle system utilized Preoxygenation: Pre-oxygenation with 100% oxygen Induction Type: IV induction Ventilation: Mask ventilation without difficulty LMA: LMA inserted LMA Size: 4.0 Number of attempts: 1 Placement Confirmation: positive ETCO2 and breath sounds checked- equal and bilateral Tube secured with: Tape Dental Injury: Teeth and Oropharynx as per pre-operative assessment

## 2018-06-22 NOTE — Transfer of Care (Signed)
Immediate Anesthesia Transfer of Care Note  Patient: Meghan [name]  Procedure(s) Performed: DILATATION AND CURETTAGE /HYSTEROSCOPY (N/A )  Patient Location: PACU  Anesthesia Type:General  Level of Consciousness: drowsy  Airway & Oxygen Therapy: Patient Spontanous Breathing and Patient connected to nasal cannula oxygen  Post-op Assessment: Report given to RN and Post -op Vital signs reviewed and stable  Post vital signs: Reviewed and stable  Last Vitals:  Vitals Value Taken Time  BP 138/91 06/22/2018  9:11 AM  Temp 36.3 C 06/22/2018  9:11 AM  Pulse 99 06/22/2018  9:13 AM  Resp 19 06/22/2018  9:13 AM  SpO2 97 % 06/22/2018  9:13 AM  Vitals shown include unvalidated device data.  Last Pain:  Vitals:   06/22/18 0642  TempSrc: Oral  PainSc: 0-No pain      Patients Stated Pain Goal: 5 (33/35/45 6256)  Complications: No apparent anesthesia complications

## 2018-06-23 ENCOUNTER — Encounter (HOSPITAL_BASED_OUTPATIENT_CLINIC_OR_DEPARTMENT_OTHER): Payer: Self-pay | Admitting: Obstetrics

## 2018-08-02 ENCOUNTER — Other Ambulatory Visit: Payer: Self-pay | Admitting: Physician Assistant

## 2018-08-02 DIAGNOSIS — F411 Generalized anxiety disorder: Secondary | ICD-10-CM

## 2018-08-02 MED ORDER — CLONAZEPAM 1 MG PO TABS: ORAL_TABLET | ORAL | 1 refills | Status: DC

## 2018-08-02 NOTE — Telephone Encounter (Signed)
Meghan [name] w/ [facility] is calling to have a new Rx written for clonazePAM (KLONOPIN) 1 MG tablet .  Pt's insurance will only pay for 3 months at at time. Please send new written Rx to:  [id number] [name], [place] [phone] (Phone) [phone] (Fax)    Thanks, [place]

## 2018-08-09 ENCOUNTER — Emergency Department
Admission: EM | Admit: 2018-08-09 | Discharge: 2018-08-09 | Discharge status: Against Medical Advice | Payer: Managed Care, Other (non HMO)

## 2018-08-09 ENCOUNTER — Telehealth: Payer: Self-pay

## 2018-08-09 ENCOUNTER — Telehealth: Payer: Self-pay | Admitting: Physician Assistant

## 2018-08-09 NOTE — Telephone Encounter (Signed)
Yes agreed ER eval. Sounds dehydrated.

## 2018-08-09 NOTE — Telephone Encounter (Signed)
Patient's daughter called stating that they were in the parking lot. Her mother Meghan [name] was at the gym working out and became very dizzy to the point she can't stand up. She is seeing spots in her eyes and is very weak. Advised to go to the emergency room for further evaluation.

## 2018-08-09 NOTE — Telephone Encounter (Signed)
Made an appt for pt for tomorrow

## 2018-08-10 ENCOUNTER — Other Ambulatory Visit: Payer: Self-pay

## 2018-08-10 ENCOUNTER — Ambulatory Visit: Payer: Managed Care, Other (non HMO) | Admitting: Family Medicine

## 2018-08-10 VITALS — BP 122/74 | HR 72 | Temp 97.4°F | Resp 18 | Wt 192.6 lb

## 2018-08-10 DIAGNOSIS — R42 Dizziness and giddiness: Secondary | ICD-10-CM

## 2018-08-10 DIAGNOSIS — R002 Palpitations: Secondary | ICD-10-CM | POA: Diagnosis not present

## 2018-08-10 DIAGNOSIS — E860 Dehydration: Secondary | ICD-10-CM | POA: Diagnosis not present

## 2018-08-10 MED ORDER — MECLIZINE HCL 25 MG PO TABS: 25.0000 mg | ORAL_TABLET | Freq: Three times a day (TID) | ORAL | 0 refills | Status: DC | PRN

## 2018-08-10 NOTE — Progress Notes (Signed)
cb      Patient: Meghan [name] Female    DOB: 11/30/1970   48 y.o.   MRN: [id number] Visit Date: 08/10/2018  Today's Provider: Wilhemena Durie, MD   Chief Complaint  Patient presents with  . Dizziness   Subjective:     HPI  Patient experienced some dizziness and vision changes while doing weight training at the gym yesterday. On Monday she felt more tired that usual on Monday. She also felt the dizziness last night while laying on her back and this morning. No syncope or chest pain.  Allergies  Allergen Reactions  . Contrast Media [Iodinated Diagnostic Agents] Hives  . Sulfa Antibiotics Swelling    Facial swelling     Current Outpatient Medications:  .  clonazePAM (KLONOPIN) 1 MG tablet, TAKE 1 TABLET BY MOUTH EVERY DAY, Disp: 90 tablet, Rfl: 1 .  ibuprofen (ADVIL) 200 MG tablet, Take 800 mg by mouth every 6 (six) hours as needed., Disp: , Rfl:  .  omeprazole (PRILOSEC) 40 MG capsule, Take 1 capsule (40 mg total) by mouth daily. (Patient taking differently: Take 40 mg by mouth daily. ), Disp: 90 capsule, Rfl: 1 .  predniSONE (DELTASONE) 5 MG tablet, Take 5 mg by mouth as needed (for RA). Per pt takes 5- 10 mg for no longer than a week when as flare-up, Disp: , Rfl:  .  acetaminophen (TYLENOL) 500 MG tablet, Take 1,000 mg by mouth every 6 (six) hours as needed., Disp: , Rfl:  .  oxyCODONE (OXY IR/ROXICODONE) 5 MG immediate release tablet, Take 1 tablet (5 mg total) by mouth every 6 (six) hours as needed for severe pain. (Patient not taking: Reported on 08/10/2018), Disp: 6 tablet, Rfl: 0  Review of Systems  Eyes: Negative.   Respiratory: Negative.   Cardiovascular: Negative.   Endocrine: Negative.   Genitourinary: Negative.   Allergic/Immunologic: Negative.   Neurological: Positive for dizziness and light-headedness.  Hematological: Negative.   Psychiatric/Behavioral: Negative.   All other systems reviewed and are negative.   Social History   Tobacco Use  .  Smoking status: Current Every Day Smoker    Packs/day: 1.00    Years: 25.00    Pack years: 25.00    Types: Cigarettes  . Smokeless tobacco: Never Used  Substance Use Topics  . Alcohol use: Yes    Comment: social      Objective:   BP 122/74 (BP Location: Left Arm, Patient Position: Sitting, Cuff Size: Normal)   Pulse 72   Temp (!) 97.4 F (36.3 C) (Oral)   Resp 18   Wt 192 lb 9.6 oz (87.4 kg)   LMP 08/13/2014   SpO2 98%   BMI 31.09 kg/m  Vitals:   08/10/18 0854  BP: 122/74  Pulse: 72  Resp: 18  Temp: (!) 97.4 F (36.3 C)  TempSrc: Oral  SpO2: 98%  Weight: 192 lb 9.6 oz (87.4 kg)     Physical Exam Vitals signs reviewed.  HENT:     Head: Normocephalic and atraumatic.     Nose: Nose normal.  Eyes:     General: No scleral icterus. Neck:     Vascular: No carotid bruit.  Cardiovascular:     Rate and Rhythm: Normal rate and regular rhythm.     Pulses: Normal pulses.     Heart sounds: Normal heart sounds.  Pulmonary:     Effort: Pulmonary effort is normal.     Breath sounds: Normal breath sounds.  Abdominal:  Palpations: Abdomen is soft.  Musculoskeletal:     Right lower leg: No edema.     Left lower leg: No edema.  Skin:    General: Skin is warm and dry.  Neurological:     General: No focal deficit present.     Mental Status: She is alert and oriented to person, place, and time.  Psychiatric:        Mood and Affect: Mood normal.        Behavior: Behavior normal.        Thought Content: Thought content normal.        Judgment: Judgment normal.    ECG -NSR with some anterior T wave inversion compared to 2017.  No results found for any visits on 08/10/18.     Assessment & Plan    1. Dizziness More than 50% 25 minute visit spent in counseling or coordination of care  - EKG 12-Lead - CBC w/Diff/Platelet - Comp. Metabolic Panel (12)  2. Vertigo Try meclizine prn. - CBC w/Diff/Platelet - Comp. Metabolic Panel (12)  3. Heart palpitations May  need cardiology if this persists.  4. Dehydration Hydrate.     Render Marley Cranford Mon, MD  [facility]

## 2018-08-11 LAB — CBC WITH DIFFERENTIAL/PLATELET
Basophils Absolute: 0 10*3/uL (ref 0.0–0.2)
Basos: 1 %
EOS (ABSOLUTE): 0.1 10*3/uL (ref 0.0–0.4)
Eos: 2 %
Hematocrit: 44.5 % (ref 34.0–46.6)
Hemoglobin: 15 g/dL (ref 11.1–15.9)
Immature Grans (Abs): 0 10*3/uL (ref 0.0–0.1)
Immature Granulocytes: 0 %
Lymphocytes Absolute: 1.9 10*3/uL (ref 0.7–3.1)
Lymphs: 38 %
MCH: 29.2 pg (ref 26.6–33.0)
MCHC: 33.7 g/dL (ref 31.5–35.7)
MCV: 87 fL (ref 79–97)
Monocytes Absolute: 0.4 10*3/uL (ref 0.1–0.9)
Monocytes: 8 %
Neutrophils Absolute: 2.6 10*3/uL (ref 1.4–7.0)
Neutrophils: 51 %
Platelets: 258 10*3/uL (ref 150–450)
RBC: 5.13 x10E6/uL (ref 3.77–5.28)
RDW: 13.1 % (ref 11.7–15.4)
WBC: 5.1 10*3/uL (ref 3.4–10.8)

## 2018-08-11 LAB — COMP. METABOLIC PANEL (12)
AST: 20 IU/L (ref 0–40)
Albumin/Globulin Ratio: 1 — ABNORMAL LOW (ref 1.2–2.2)
Albumin: 4.1 g/dL (ref 3.8–4.8)
Alkaline Phosphatase: 93 IU/L (ref 39–117)
BUN/Creatinine Ratio: 17 (ref 9–23)
BUN: 13 mg/dL (ref 6–24)
Bilirubin Total: 0.3 mg/dL (ref 0.0–1.2)
Calcium: 9.1 mg/dL (ref 8.7–10.2)
Chloride: 102 mmol/L (ref 96–106)
Creatinine, Ser: 0.78 mg/dL (ref 0.57–1.00)
GFR calc Af Amer: 104 mL/min/{1.73_m2} (ref >59)
GFR calc non Af Amer: 90 mL/min/{1.73_m2} (ref >59)
Globulin, Total: 4.2 g/dL (ref 1.5–4.5)
Glucose: 86 mg/dL (ref 65–99)
Potassium: 4.3 mmol/L (ref 3.5–5.2)
Sodium: 138 mmol/L (ref 134–144)
Total Protein: 8.3 g/dL (ref 6.0–8.5)

## 2018-08-30 ENCOUNTER — Encounter: Payer: Self-pay | Admitting: Physician Assistant

## 2018-08-30 ENCOUNTER — Other Ambulatory Visit: Payer: Self-pay

## 2018-08-30 ENCOUNTER — Ambulatory Visit (INDEPENDENT_AMBULATORY_CARE_PROVIDER_SITE_OTHER): Payer: Managed Care, Other (non HMO) | Admitting: Physician Assistant

## 2018-08-30 VITALS — Wt 185.0 lb

## 2018-08-30 DIAGNOSIS — J0140 Acute pansinusitis, unspecified: Secondary | ICD-10-CM

## 2018-08-30 DIAGNOSIS — T3695XA Adverse effect of unspecified systemic antibiotic, initial encounter: Secondary | ICD-10-CM

## 2018-08-30 DIAGNOSIS — B379 Candidiasis, unspecified: Secondary | ICD-10-CM

## 2018-08-30 DIAGNOSIS — J029 Acute pharyngitis, unspecified: Secondary | ICD-10-CM

## 2018-08-30 DIAGNOSIS — Z20822 Contact with and (suspected) exposure to covid-19: Secondary | ICD-10-CM

## 2018-08-30 DIAGNOSIS — Z20828 Contact with and (suspected) exposure to other viral communicable diseases: Secondary | ICD-10-CM | POA: Diagnosis not present

## 2018-08-30 MED ORDER — FLUCONAZOLE 150 MG PO TABS: 150.0000 mg | ORAL_TABLET | Freq: Once | ORAL | 0 refills | Status: AC

## 2018-08-30 MED ORDER — AMOXICILLIN-POT CLAVULANATE 875-125 MG PO TABS: 1.0000 | ORAL_TABLET | Freq: Two times a day (BID) | ORAL | 0 refills | Status: DC

## 2018-08-30 NOTE — Progress Notes (Signed)
Patient: Meghan [name] Female    DOB: Jun 01, 1970   48 y.o.   MRN: [id number] Visit Date: 08/30/2018  Today's Provider: Mar Daring, PA-C   Chief Complaint  Patient presents with  . Sore Throat   Subjective:     Virtual Visit via Telephone Note  I connected with Dorma Russell on 08/30/18 at  2:00 PM EDT by telephone and verified that I am speaking with the correct person using two identifiers.  Location: Patient: home Provider: [facility]   I discussed the limitations, risks, security and privacy concerns of performing an evaluation and management service by telephone and the availability of in person appointments. I also discussed with the patient that there may be a patient responsible charge related to this service. The patient expressed understanding and agreed to proceed.   Sore Throat  This is a new problem. The current episode started in the past 7 days (over the weekend). The problem has been unchanged. There has been no fever. Associated symptoms include coughing and headaches ("dull"). Pertinent negatives include no diarrhea, shortness of breath, trouble swallowing or vomiting. She has tried NSAIDs for the symptoms. The treatment provided no relief.  Patient reports that she was at a funeral last week and some of the people have started to have symptoms last week and loss of smell. No one has tested positive yet.    Allergies  Allergen Reactions  . Contrast Media [Iodinated Diagnostic Agents] Hives  . Sulfa Antibiotics Swelling    Facial swelling     Current Outpatient Medications:  .  acetaminophen (TYLENOL) 500 MG tablet, Take 1,000 mg by mouth every 6 (six) hours as needed., Disp: , Rfl:  .  clonazePAM (KLONOPIN) 1 MG tablet, TAKE 1 TABLET BY MOUTH EVERY DAY, Disp: 90 tablet, Rfl: 1 .  ibuprofen (ADVIL) 200 MG tablet, Take 800 mg by mouth every 6 (six) hours as needed., Disp: , Rfl:  .  meclizine (ANTIVERT) 25 MG tablet, Take 1 tablet (25 mg total)  by mouth 3 (three) times daily as needed for dizziness., Disp: 30 tablet, Rfl: 0 .  omeprazole (PRILOSEC) 40 MG capsule, Take 1 capsule (40 mg total) by mouth daily. (Patient taking differently: Take 40 mg by mouth daily. ), Disp: 90 capsule, Rfl: 1 .  predniSONE (DELTASONE) 5 MG tablet, Take 5 mg by mouth as needed (for RA). Per pt takes 5- 10 mg for no longer than a week when as flare-up, Disp: , Rfl:   Review of Systems  Constitutional: Negative for fever.  HENT: Positive for postnasal drip, sinus pressure and sore throat. Negative for trouble swallowing.   Respiratory: Positive for cough and chest tightness ("a little"). Negative for shortness of breath.   Cardiovascular: Negative for chest pain, palpitations and leg swelling.  Gastrointestinal: Negative for diarrhea and vomiting.  Neurological: Positive for headaches ("dull").    Social History   Tobacco Use  . Smoking status: Current Every Day Smoker    Packs/day: 1.00    Years: 25.00    Pack years: 25.00    Types: Cigarettes  . Smokeless tobacco: Never Used  Substance Use Topics  . Alcohol use: Yes    Comment: social      Objective:   Wt 185 lb (83.9 kg)   LMP 08/13/2014   BMI 29.86 kg/m  Vitals:   08/30/18 1326  Weight: 185 lb (83.9 kg)     Physical Exam Vitals signs reviewed.  Constitutional:  General: She is not in acute distress. Pulmonary:     Effort: No respiratory distress.  Neurological:     Mental Status: She is alert.      No results found for any visits on 08/30/18.     Assessment & Plan     1. Exposure to Covid-19 Virus Covid testing ordered. Isolation guidelines discussed. Information provided through mychart.  - Novel Coronavirus, NAA ([facility])  2. Sore throat Started Saturday. Advised to try salt water gargles.   3. Acute non-recurrent pansinusitis Will give augmentin as below. If covid positive she is aware to not start the antibiotic. If covid negative she will have the  antibiotic to go ahead and start. Continue allergy medications and push fluids. Call if worsening.   4. Antibiotic-induced yeast infection Gets yeast infections with antibiotics. Diflucan given as below.    I discussed the assessment and treatment plan with the patient. The patient was provided an opportunity to ask questions and all were answered. The patient agreed with the plan and demonstrated an understanding of the instructions.   The patient was advised to call back or seek an in-person evaluation if the symptoms worsen or if the condition fails to improve as anticipated.  I provided 14 minutes of non-face-to-face time during this encounter.    Mar Daring, PA-C  [facility]

## 2018-08-30 NOTE — Patient Instructions (Signed)
Person Under Monitoring Name: Meghan [name]  Location: [address] [place] [address]   Infection Prevention Recommendations for Individuals Confirmed to have, or Being Evaluated for, 2019 Novel Coronavirus (COVID-19) Infection Who Receive Care at Home  Individuals who are confirmed to have, or are being evaluated for, COVID-19 should follow the prevention steps below until a healthcare provider or local or state health department says they can return to normal activities.  Stay home except to get medical care You should restrict activities outside your home, except for getting medical care. Do not go to work, school, or public areas, and do not use public transportation or taxis.  Call ahead before visiting your doctor Before your medical appointment, call the healthcare provider and tell them that you have, or are being evaluated for, COVID-19 infection. This will help the healthcare provider's office take steps to keep other people from getting infected. Ask your healthcare provider to call the local or state health department.  Monitor your symptoms Seek prompt medical attention if your illness is worsening (e.g., difficulty breathing). Before going to your medical appointment, call the healthcare provider and tell them that you have, or are being evaluated for, COVID-19 infection. Ask your healthcare provider to call the local or state health department.  Wear a facemask You should wear a facemask that covers your nose and mouth when you are in the same room with other people and when you visit a healthcare provider. People who live with or visit you should also wear a facemask while they are in the same room with you.  Separate yourself from other people in your home As much as possible, you should stay in a different room from other people in your home. Also, you should use a separate bathroom, if available.  Avoid sharing household items You should not share  dishes, drinking glasses, cups, eating utensils, towels, bedding, or other items with other people in your home. After using these items, you should wash them thoroughly with soap and water.  Cover your coughs and sneezes Cover your mouth and nose with a tissue when you cough or sneeze, or you can cough or sneeze into your sleeve. Throw used tissues in a lined trash can, and immediately wash your hands with soap and water for at least 20 seconds or use an alcohol-based hand rub.  Wash your [facility] your hands often and thoroughly with soap and water for at least 20 seconds. You can use an alcohol-based hand sanitizer if soap and water are not available and if your hands are not visibly dirty. Avoid touching your eyes, nose, and mouth with unwashed hands.   Prevention Steps for Caregivers and Household Members of Individuals Confirmed to have, or Being Evaluated for, COVID-19 Infection Being Cared for in the Home  If you live with, or provide care at home for, a person confirmed to have, or being evaluated for, COVID-19 infection please follow these guidelines to prevent infection:  Follow healthcare provider's instructions Make sure that you understand and can help the patient follow any healthcare provider instructions for all care.  Provide for the patient's basic needs You should help the patient with basic needs in the home and provide support for getting groceries, prescriptions, and other personal needs.  Monitor the patient's symptoms If they are getting sicker, call his or her medical provider and tell them that the patient has, or is being evaluated for, COVID-19 infection. This will help the healthcare provider's office  take steps to keep other people from getting infected. Ask the healthcare provider to call the local or state health department.  Limit the number of people who have contact with the patient  If possible, have only one caregiver for the patient.  Other  household members should stay in another home or place of residence. If this is not possible, they should stay  in another room, or be separated from the patient as much as possible. Use a separate bathroom, if available.  Restrict visitors who do not have an essential need to be in the home.  Keep older adults, very young children, and other sick people away from the patient Keep older adults, very young children, and those who have compromised immune systems or chronic health conditions away from the patient. This includes people with chronic heart, lung, or kidney conditions, diabetes, and cancer.  Ensure good ventilation Make sure that shared spaces in the home have good air flow, such as from an air conditioner or an opened window, weather permitting.  Wash your hands often  Wash your hands often and thoroughly with soap and water for at least 20 seconds. You can use an alcohol based hand sanitizer if soap and water are not available and if your hands are not visibly dirty.  Avoid touching your eyes, nose, and mouth with unwashed hands.  Use disposable paper towels to dry your hands. If not available, use dedicated cloth towels and replace them when they become wet.  Wear a facemask and gloves  Wear a disposable facemask at all times in the room and gloves when you touch or have contact with the patient's blood, body fluids, and/or secretions or excretions, such as sweat, saliva, sputum, nasal mucus, vomit, urine, or feces.  Ensure the mask fits over your nose and mouth tightly, and do not touch it during use.  Throw out disposable facemasks and gloves after using them. Do not reuse.  Wash your hands immediately after removing your facemask and gloves.  If your personal clothing becomes contaminated, carefully remove clothing and launder. Wash your hands after handling contaminated clothing.  Place all used disposable facemasks, gloves, and other waste in a lined container before  disposing them with other household waste.  Remove gloves and wash your hands immediately after handling these items.  Do not share dishes, glasses, or other household items with the patient  Avoid sharing household items. You should not share dishes, drinking glasses, cups, eating utensils, towels, bedding, or other items with a patient who is confirmed to have, or being evaluated for, COVID-19 infection.  After the person uses these items, you should wash them thoroughly with soap and water.  Wash laundry thoroughly  Immediately remove and wash clothes or bedding that have blood, body fluids, and/or secretions or excretions, such as sweat, saliva, sputum, nasal mucus, vomit, urine, or feces, on them.  Wear gloves when handling laundry from the patient.  Read and follow directions on labels of laundry or clothing items and detergent. In general, wash and dry with the warmest temperatures recommended on the label.  Clean all areas the individual has used often  Clean all touchable surfaces, such as counters, tabletops, doorknobs, bathroom fixtures, toilets, phones, keyboards, tablets, and bedside tables, every day. Also, clean any surfaces that may have blood, body fluids, and/or secretions or excretions on them.  Wear gloves when cleaning surfaces the patient has come in contact with.  Use a diluted bleach solution (e.g., dilute bleach with 1 part  bleach and 10 parts water) or a household disinfectant with a label that says EPA-registered for coronaviruses. To make a bleach solution at home, add 1 tablespoon of bleach to 1 quart (4 cups) of water. For a larger supply, add  cup of bleach to 1 gallon (16 cups) of water.  Read labels of cleaning products and follow recommendations provided on product labels. Labels contain instructions for safe and effective use of the cleaning product including precautions you should take when applying the product, such as wearing gloves or eye protection  and making sure you have good ventilation during use of the product.  Remove gloves and wash hands immediately after cleaning.  Monitor yourself for signs and symptoms of illness Caregivers and household members are considered close contacts, should monitor their health, and will be asked to limit movement outside of the home to the extent possible. Follow the monitoring steps for close contacts listed on the symptom monitoring form.   ? If you have additional questions, contact your local health department or call the epidemiologist on call at [phone] (available 24/7). ? This guidance is subject to change. For the most up-to-date guidance from CDC, please refer to their website: YouBlogs.pl  Can take to lessen severity: Vit C 500mg  twice daily Quercertin 250-500mg  twice daily Zinc 75-100mg  daily Melatonin 3-6 mg at bedtime Vit D3 1000-2000 IU daily Aspirin 81 mg daily with food Optional: Famotidine 20mg  daily Also can add tylenol/ibuprofen as needed for fevers and body aches May add Mucinex or Mucinex DM as needed for cough/congestion

## 2018-08-31 LAB — NOVEL CORONAVIRUS, NAA: SARS-CoV-2, NAA: NOT DETECTED

## 2018-09-01 ENCOUNTER — Telehealth: Payer: Self-pay

## 2018-09-01 NOTE — Telephone Encounter (Signed)
Patient advised.

## 2018-09-01 NOTE — Telephone Encounter (Signed)
-----   Message from Mar Daring, [place] sent at 09/01/2018  9:01 AM EDT ----- Covid testing is negative.

## 2018-09-22 ENCOUNTER — Telehealth: Payer: Self-pay | Admitting: Physician Assistant

## 2018-09-22 NOTE — Telephone Encounter (Signed)
She can schedule tomorrow

## 2018-09-22 NOTE — Telephone Encounter (Signed)
Pain lower back, also lower abd going down her legs.  No her symptoms.   Using heating pad and taking Advil. No burning when she urinates and no vaginal bleeding.  This all started last night.  Pt's CB#  [phone]  Meghan [name]

## 2018-09-22 NOTE — Telephone Encounter (Signed)
Pt calling back asking for a call back today please. Pt is in a lot of pain.  Thanks, [employer]

## 2018-09-23 ENCOUNTER — Encounter: Payer: Self-pay | Admitting: Physician Assistant

## 2018-09-23 ENCOUNTER — Other Ambulatory Visit: Payer: Self-pay

## 2018-09-23 ENCOUNTER — Ambulatory Visit
Admission: RE | Admit: 2018-09-23 | Discharge: 2018-09-23 | Disposition: A | Discharge status: Home / Self Care | Payer: Managed Care, Other (non HMO) | Source: Ambulatory Visit | Attending: Physician Assistant | Admitting: Physician Assistant

## 2018-09-23 ENCOUNTER — Ambulatory Visit (INDEPENDENT_AMBULATORY_CARE_PROVIDER_SITE_OTHER): Payer: Managed Care, Other (non HMO) | Admitting: Physician Assistant

## 2018-09-23 ENCOUNTER — Telehealth: Payer: Self-pay

## 2018-09-23 VITALS — BP 101/69 | HR 88 | Temp 96.9°F | Resp 16 | Ht 66.0 in | Wt 188.0 lb

## 2018-09-23 DIAGNOSIS — R102 Pelvic and perineal pain: Secondary | ICD-10-CM | POA: Diagnosis not present

## 2018-09-23 DIAGNOSIS — M545 Low back pain, unspecified: Secondary | ICD-10-CM

## 2018-09-23 DIAGNOSIS — R110 Nausea: Secondary | ICD-10-CM

## 2018-09-23 LAB — POCT URINALYSIS DIPSTICK
Appearance: ABNORMAL
Blood, UA: NEGATIVE
Glucose, UA: NEGATIVE
Leukocytes, UA: NEGATIVE
Nitrite, UA: NEGATIVE
Odor: NORMAL
Protein, UA: POSITIVE — AB
Spec Grav, UA: >=1.03 — AB (ref 1.010–1.025)
Urobilinogen, UA: 0.2 E.U./dL
pH, UA: 6 (ref 5.0–8.0)

## 2018-09-23 MED ORDER — CYCLOBENZAPRINE HCL 5 MG PO TABS: 5.0000 mg | ORAL_TABLET | Freq: Three times a day (TID) | ORAL | 1 refills | Status: AC | PRN

## 2018-09-23 MED ORDER — OXYCODONE-ACETAMINOPHEN 5-325 MG PO TABS: 1.0000 | ORAL_TABLET | ORAL | 0 refills | Status: DC | PRN

## 2018-09-23 MED ORDER — ONDANSETRON HCL 4 MG PO TABS: 4.0000 mg | ORAL_TABLET | Freq: Three times a day (TID) | ORAL | 0 refills | Status: AC | PRN

## 2018-09-23 NOTE — Patient Instructions (Signed)

## 2018-09-23 NOTE — Progress Notes (Signed)
Patient: Meghan [name] Female    DOB: Nov 07, 1970   48 y.o.   MRN: [id number] Visit Date: 09/23/2018  Today's Provider: Mar Daring, PA-C   Chief Complaint  Patient presents with   Back Pain   Subjective:     Back Pain This is a new problem. The current episode started in the past 7 days (2 days). The problem occurs constantly. The pain is at a severity of 5/10. The pain is moderate. The pain is the same all the time. The symptoms are aggravated by lying down, bending, coughing, position, standing, sitting and twisting. Associated symptoms include abdominal pain, leg pain, pelvic pain and weight loss. Pertinent negatives include no bladder incontinence, bowel incontinence, chest pain, dysuria, fever, headaches, numbness, paresis, paresthesias, perianal numbness, tingling or weakness. She has tried NSAIDs for the symptoms.    Patient states she has had low back pain for 2 days. Patient states pain radiates from mid low back around to her pelvis and down to her thighs. Patient states she has had 3 episodes like this in the past month.   She has been exercising and doing strength training for her RA. She has tried steroids and methotrexate for her RA. She is currently not on treatment.   Allergies  Allergen Reactions   Contrast Media [Iodinated Diagnostic Agents] Hives   Sulfa Antibiotics Swelling    Facial swelling     Current Outpatient Medications:    acetaminophen (TYLENOL) 500 MG tablet, Take 1,000 mg by mouth every 6 (six) hours as needed., Disp: , Rfl:    clonazePAM (KLONOPIN) 1 MG tablet, TAKE 1 TABLET BY MOUTH EVERY DAY, Disp: 90 tablet, Rfl: 1   ibuprofen (ADVIL) 200 MG tablet, Take 800 mg by mouth every 6 (six) hours as needed., Disp: , Rfl:    meclizine (ANTIVERT) 25 MG tablet, Take 1 tablet (25 mg total) by mouth 3 (three) times daily as needed for dizziness., Disp: 30 tablet, Rfl: 0   predniSONE (DELTASONE) 5 MG tablet, Take 5 mg by mouth as  needed (for RA). Per pt takes 5- 10 mg for no longer than a week when as flare-up, Disp: , Rfl:    amoxicillin-clavulanate (AUGMENTIN) 875-125 MG tablet, Take 1 tablet by mouth 2 (two) times daily., Disp: 20 tablet, Rfl: 0   omeprazole (PRILOSEC) 40 MG capsule, Take 1 capsule (40 mg total) by mouth daily. (Patient not taking: Reported on 09/23/2018), Disp: 90 capsule, Rfl: 1  Review of Systems  Constitutional: Positive for weight loss. Negative for appetite change, chills, fatigue and fever.  Respiratory: Negative for chest tightness and shortness of breath.   Cardiovascular: Negative for chest pain and palpitations.  Gastrointestinal: Positive for abdominal pain. Negative for bowel incontinence, nausea and vomiting.  Genitourinary: Positive for pelvic pain. Negative for bladder incontinence and dysuria.  Musculoskeletal: Positive for back pain and myalgias.  Neurological: Negative for dizziness, tingling, weakness, numbness, headaches and paresthesias.    Social History   Tobacco Use   Smoking status: Current Every Day Smoker    Packs/day: 1.00    Years: 25.00    Pack years: 25.00    Types: Cigarettes   Smokeless tobacco: Never Used  Substance Use Topics   Alcohol use: Yes    Comment: social      Objective:   BP 101/69 (BP Location: Left Arm, Patient Position: Sitting, Cuff Size: Large)    Pulse 88    Temp (!) 96.9 F (36.1 C) (  Other (Comment))    Resp 16    Ht 5\' 6"  (1.676 m)    Wt 188 lb (85.3 kg)    LMP 08/13/2014    SpO2 96%    BMI 30.34 kg/m  Vitals:   09/23/18 1558  BP: 101/69  Pulse: 88  Resp: 16  Temp: (!) 96.9 F (36.1 C)  TempSrc: Other (Comment)  SpO2: 96%  Weight: 188 lb (85.3 kg)  Height: 5\' 6"  (1.676 m)  Body mass index is 30.34 kg/m.   Physical Exam Vitals signs reviewed.  Constitutional:      General: She is not in acute distress.    Appearance: Normal appearance. She is well-developed. She is not ill-appearing or diaphoretic.  Neck:      Musculoskeletal: Normal range of motion and neck supple.  Cardiovascular:     Rate and Rhythm: Normal rate and regular rhythm.     Pulses: Normal pulses.     Heart sounds: Normal heart sounds. No murmur. No friction rub. No gallop.   Pulmonary:     Effort: Pulmonary effort is normal. No respiratory distress.     Breath sounds: Normal breath sounds. No wheezing or rales.  Musculoskeletal:     Lumbar back: She exhibits decreased range of motion (lateral flexion, forward flexion, extension, and rotation), tenderness, bony tenderness (tender over spinous process near L3-L4 possibly), pain and spasm. She exhibits normal pulse.  Neurological:     Mental Status: She is alert.      No results found for any visits on 09/23/18.     Assessment & Plan    1. Pelvic pain in female UA normal except for concentrated. Advised to push fluids. - POCT urinalysis dipstick  2. Acute midline low back pain without sciatica Point tender over L3 to L4 spinous processes. Will get imaging as below as I am concerned for some anterolisthesis in the area. This can be complicated by her RA. Will treat with Percocet ([place] reviewed) and flexeril as below. Continue heating pad. Back exercises sent through patient instructions. I will f/u pending xrays.  - DG Lumbar Spine Complete; Future - cyclobenzaprine (FLEXERIL) 5 MG tablet; Take 1 tablet (5 mg total) by mouth 3 (three) times daily as needed for muscle spasms.  Dispense: 30 tablet; Refill: 1 - oxyCODONE-acetaminophen (PERCOCET/ROXICET) 5-325 MG tablet; Take 1 tablet by mouth every 4 (four) hours as needed for severe pain.  Dispense: 20 tablet; Refill: 0  3. Nausea Gets nauseated with narcotic pain medications.  - ondansetron (ZOFRAN) 4 MG tablet; Take 1 tablet (4 mg total) by mouth every 8 (eight) hours as needed.  Dispense: 20 tablet; Refill: 0     Mar Daring, PA-C  [facility]

## 2018-09-23 NOTE — Telephone Encounter (Signed)
If patient calls back please let her know I put her down for 3:40 pm with Tawanna Sat today that's the only time available.

## 2018-09-23 NOTE — Telephone Encounter (Signed)
Patient notified of appointment time.

## 2018-09-26 ENCOUNTER — Telehealth: Payer: Self-pay

## 2018-09-26 NOTE — Telephone Encounter (Signed)
Patient reports that she still hurting and has stiffness present in the morning and is asking if this going to subside. She just started taking the prednisone.

## 2018-09-26 NOTE — Telephone Encounter (Signed)
Hopefully prednisone will help. If not helping will need to refer to orthopedics spine.

## 2018-09-26 NOTE — Telephone Encounter (Signed)
LMTCB

## 2018-09-26 NOTE — Telephone Encounter (Signed)
-----   Message from Mar Daring, PA-C sent at 09/26/2018  8:27 AM EDT ----- There is disc space narrowing at L2-L3 and L5-S1. L2-L3 is the area that you were very point tender on exam.

## 2018-09-28 NOTE — Telephone Encounter (Signed)
Patient advised and state that the prednisone is helping

## 2018-11-08 ENCOUNTER — Other Ambulatory Visit: Payer: Self-pay

## 2018-11-08 DIAGNOSIS — Z20822 Contact with and (suspected) exposure to covid-19: Secondary | ICD-10-CM

## 2018-11-10 LAB — NOVEL CORONAVIRUS, NAA: SARS-CoV-2, NAA: NOT DETECTED

## 2019-01-10 ENCOUNTER — Encounter: Payer: Self-pay | Admitting: Physician Assistant

## 2019-02-12 ENCOUNTER — Encounter: Payer: Self-pay | Admitting: Physician Assistant

## 2019-02-12 DIAGNOSIS — S39012A Strain of muscle, fascia and tendon of lower back, initial encounter: Secondary | ICD-10-CM

## 2019-02-13 MED ORDER — IBUPROFEN 800 MG PO TABS: 800.0000 mg | ORAL_TABLET | Freq: Three times a day (TID) | ORAL | 1 refills | Status: DC | PRN

## 2019-02-13 MED ORDER — METHYLPREDNISOLONE 4 MG PO TBPK: ORAL_TABLET | ORAL | 0 refills | Status: DC

## 2019-02-13 NOTE — Addendum Note (Signed)
Addended by: Mar Daring on: 02/13/2019 09:57 AM   Modules accepted: Orders

## 2019-02-28 ENCOUNTER — Other Ambulatory Visit: Payer: Self-pay | Admitting: Physician Assistant

## 2019-02-28 DIAGNOSIS — Z1231 Encounter for screening mammogram for malignant neoplasm of breast: Secondary | ICD-10-CM

## 2019-03-26 ENCOUNTER — Other Ambulatory Visit: Payer: Self-pay | Admitting: Physician Assistant

## 2019-03-26 DIAGNOSIS — F411 Generalized anxiety disorder: Secondary | ICD-10-CM

## 2019-05-29 ENCOUNTER — Encounter: Payer: Self-pay | Admitting: Physician Assistant

## 2019-06-05 ENCOUNTER — Ambulatory Visit
Admission: RE | Admit: 2019-06-05 | Discharge: 2019-06-05 | Disposition: A | Discharge status: Home / Self Care | Payer: Managed Care, Other (non HMO) | Source: Ambulatory Visit | Attending: Physician Assistant | Admitting: Physician Assistant

## 2019-06-05 DIAGNOSIS — Z1231 Encounter for screening mammogram for malignant neoplasm of breast: Secondary | ICD-10-CM

## 2019-06-08 ENCOUNTER — Ambulatory Visit: Payer: Managed Care, Other (non HMO) | Admitting: Physician Assistant

## 2019-06-08 ENCOUNTER — Encounter: Payer: Self-pay | Admitting: Physician Assistant

## 2019-06-08 ENCOUNTER — Other Ambulatory Visit: Payer: Self-pay

## 2019-06-08 VITALS — BP 110/77 | HR 100 | Temp 96.8°F | Resp 16 | Wt 185.6 lb

## 2019-06-08 DIAGNOSIS — M3500 Sicca syndrome, unspecified: Secondary | ICD-10-CM | POA: Diagnosis not present

## 2019-06-08 DIAGNOSIS — F900 Attention-deficit hyperactivity disorder, predominantly inattentive type: Secondary | ICD-10-CM

## 2019-06-08 MED ORDER — AMPHETAMINE-DEXTROAMPHET ER 15 MG PO CP24: 15.0000 mg | ORAL_CAPSULE | ORAL | 0 refills | Status: DC

## 2019-06-08 NOTE — Progress Notes (Signed)
Established patient visit   Patient: Meghan [name]   DOB: 1970/09/01   49 y.o. Female  MRN: [id number] Visit Date: 06/08/2019  Today's healthcare provider: Mar Daring, PA-C   Chief Complaint  Patient presents with  . ADD   Subjective    HPI   Patient here because she has been dealing with ADD. 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 Reports that initially she had been on Strattera. Patient would like to see if she can be placed on Adderall. Has been on Strattera for a long time. Was diagnosed with ADD in 2011.   Patient Active Problem List   Diagnosis Date Noted  . Heart palpitations 07/10/2015  . Allergic rhinitis 03/04/2015  . Attention deficit disorder 03/04/2015  . Chronic bronchitis ([place]) 03/04/2015  . Acid reflux 03/04/2015  . Panic attack 03/04/2015  . Tobacco abuse 03/04/2015  . Ovarian cyst, left 04/18/2014  . Fatigue 07/29/2009  . Clinical depression 12/26/2007  . Family history of cardiovascular disease 12/26/2007  . Family history of breast cancer 12/26/2007  . Polycythemia, secondary 12/26/2007   Past Medical History:  Diagnosis Date  . Anxiety   . Depression   . Fatigue   . GERD (gastroesophageal reflux disease)   . PMB (postmenopausal bleeding)   . RA (rheumatoid arthritis) ([place])    rheumotologist-- dr aRosanna Randy [facility])-- seropostivie RA, positive ANA       Medications: Outpatient Medications Prior to Visit  Medication Sig  . acetaminophen (TYLENOL) 500 MG tablet Take 1,000 mg by mouth every 6 (six) hours as needed.  . clonazePAM (KLONOPIN) 1 MG tablet TAKE 1 TABLET BY MOUTH EVERY DAY (Patient taking differently: daily as needed. TAKE 1 TABLET BY MOUTH EVERY DAY)  . cyclobenzaprine (FLEXERIL) 5 MG tablet Take 1 tablet (5 mg total) by mouth 3 (three) times daily as needed for muscle spasms.  Marland Kitchen ibuprofen (IBU) 800 MG tablet Take 1 tablet (800 mg total) by mouth  every 8 (eight) hours as needed. (Patient taking differently: Take 800 mg by mouth as needed. )  . meclizine (ANTIVERT) 25 MG tablet Take 1 tablet (25 mg total) by mouth 3 (three) times daily as needed for dizziness. (Patient taking differently: Take 25 mg by mouth as needed for dizziness. )  . ondansetron (ZOFRAN) 4 MG tablet Take 1 tablet (4 mg total) by mouth every 8 (eight) hours as needed.  Marland Kitchen oxyCODONE-acetaminophen (PERCOCET/ROXICET) 5-325 MG tablet Take 1 tablet by mouth every 4 (four) hours as needed for severe pain.  . [DISCONTINUED] methylPREDNISolone (MEDROL) 4 MG TBPK tablet 6 day taper; take as directed on package instructions  . [DISCONTINUED] predniSONE (DELTASONE) 5 MG tablet Take 5 mg by mouth as needed (for RA). Per pt takes 5- 10 mg for no longer than a week when as flare-up   No facility-administered medications prior to visit.    Review of Systems  Last CBC Lab Results  Component Value Date   WBC 5.1 08/10/2018   HGB 15.0 08/10/2018   HCT 44.5 08/10/2018   MCV 87 08/10/2018   MCH 29.2 08/10/2018   RDW 13.1 08/10/2018   PLT 258 [id number]   Last metabolic panel Lab Results  Component Value Date   GLUCOSE 86 08/10/2018   NA 138 08/10/2018   K 4.3 08/10/2018   CL 102 08/10/2018   CO2 22 05/20/2016   BUN 13 08/10/2018   CREATININE 0.78 08/10/2018  GFRNONAA 90 08/10/2018   GFRAA 104 08/10/2018   CALCIUM 9.1 08/10/2018   PROT 8.3 08/10/2018   ALBUMIN 4.1 08/10/2018   LABGLOB 4.2 08/10/2018   AGRATIO 1.0 (L) 08/10/2018   BILITOT 0.3 08/10/2018   ALKPHOS 93 08/10/2018   AST 20 08/10/2018   ALT 25 05/20/2016      Objective    BP 110/77 (BP Location: Left Arm, Patient Position: Sitting, Cuff Size: Large)   Pulse 100   Temp (!) 96.8 F (36 C) (Temporal)   Resp 16   Wt 185 lb 9.6 oz (84.2 kg)   LMP 08/13/2014 Comment: abalation  BMI 29.96 kg/m  BP Readings from Last 3 Encounters:  06/08/19 110/77  09/23/18 101/69  08/10/18 122/74   Wt Readings  from Last 3 Encounters:  06/08/19 185 lb 9.6 oz (84.2 kg)  09/23/18 188 lb (85.3 kg)  08/30/18 185 lb (83.9 kg)      Physical Exam Vitals reviewed.  Constitutional:      General: She is not in acute distress.    Appearance: Normal appearance. She is well-developed. She is not ill-appearing or diaphoretic.  Cardiovascular:     Rate and Rhythm: Normal rate and regular rhythm.     Pulses: Normal pulses.     Heart sounds: Normal heart sounds. No murmur. No friction rub. No gallop.   Pulmonary:     Effort: Pulmonary effort is normal. No respiratory distress.     Breath sounds: Normal breath sounds. No wheezing or rales.  Musculoskeletal:     Cervical back: Normal range of motion and neck supple.  Neurological:     Mental Status: She is alert.  Psychiatric:        Mood and Affect: Mood normal.        Behavior: Behavior normal.        Thought Content: Thought content normal.        Judgment: Judgment normal.       No results found for any visits on 06/08/19.  Assessment & Plan     1. Attention deficit hyperactivity disorder (ADHD), predominantly inattentive type Change therapy from Strattera to Adderall as below. Will start with 2 week trial for dose adjustment and titrations.  - amphetamine-dextroamphetamine (ADDERALL XR) 15 MG 24 hr capsule; Take 1 capsule by mouth every morning.  Dispense: 14 capsule; Refill: 0  2. Sjogren's syndrome, with unspecified organ involvement ([place]) Has known Sjorgen's but also has had positive RF (has more myalgias over specific joint pains). Has seen Dr. Meda Coffee and most recently Dr. Rosanna Randy, [place]. Dr. Rosanna Randy had placed her on a daily steroid then took her off, but per patient had acted like she didn't know who had put her on it. Then changed her to celebrex. Patient has not picked up this Rx. Patient did not feel comfortable and is asking for another opinion. Patient agreeable to go to [facility] and agrees to [facility] Rheum referral.  - Ambulatory referral  to Rheumatology   No follow-ups on file.      Reynolds Bowl, PA-C, have reviewed all documentation for this visit. The documentation on 06/13/19 for the exam, diagnosis, procedures, and orders are all accurate and complete.   Rubye Beach  [facility] [phone] (phone) [phone] (fax)  [place]

## 2019-06-13 ENCOUNTER — Encounter: Payer: Self-pay | Admitting: Physician Assistant

## 2019-06-13 ENCOUNTER — Other Ambulatory Visit: Payer: Self-pay

## 2019-06-13 DIAGNOSIS — F900 Attention-deficit hyperactivity disorder, predominantly inattentive type: Secondary | ICD-10-CM

## 2019-06-13 NOTE — Telephone Encounter (Signed)
Copied from [place] [id number]. Topic: Quick Communication - Rx Refill/Question >> Jun 13, 2019 10:19 AM Leim Fabry wrote: Reason for CRM: Patient calling to inform PCP that her pharmacy could not fill Rx; needs prior auth  Medication: amphetamine-dextroamphetamine (ADDERALL XR) 15 MG  Has the patient contacted their pharmacy? Yes, Patient went to pick up medication and was told she needed Prior Auth to fill Rx  Preferred Pharmacy (with phone number or street name): [id number] - [name], [place] - [address]  Phone:  [phone] Fax:  [phone]  Agent: Please be advised that RX refills may take up to 3 business days. We ask that you follow-up with your pharmacy.

## 2019-06-13 NOTE — Telephone Encounter (Signed)
Meghan [name],  I know you had this Friday? Have we heard anything or had a chance to do this?

## 2019-06-13 NOTE — Telephone Encounter (Signed)
Sent patient a via my chart message

## 2019-06-14 ENCOUNTER — Encounter: Payer: Self-pay | Admitting: Physician Assistant

## 2019-06-14 DIAGNOSIS — F900 Attention-deficit hyperactivity disorder, predominantly inattentive type: Secondary | ICD-10-CM

## 2019-06-14 MED ORDER — AMPHETAMINE-DEXTROAMPHET ER 15 MG PO CP24: 15.0000 mg | ORAL_CAPSULE | ORAL | 0 refills | Status: DC

## 2019-06-26 ENCOUNTER — Other Ambulatory Visit: Payer: Self-pay

## 2019-06-26 ENCOUNTER — Ambulatory Visit (INDEPENDENT_AMBULATORY_CARE_PROVIDER_SITE_OTHER): Payer: Self-pay

## 2019-06-26 DIAGNOSIS — L680 Hirsutism: Secondary | ICD-10-CM

## 2019-06-27 ENCOUNTER — Other Ambulatory Visit: Payer: Self-pay | Admitting: Adult Health

## 2019-06-27 DIAGNOSIS — F900 Attention-deficit hyperactivity disorder, predominantly inattentive type: Secondary | ICD-10-CM

## 2019-06-27 MED ORDER — AMPHETAMINE-DEXTROAMPHET ER 15 MG PO CP24: 15.0000 mg | ORAL_CAPSULE | ORAL | 0 refills | Status: DC

## 2019-06-27 NOTE — Progress Notes (Signed)
Meds ordered this encounter  Medications  . amphetamine-dextroamphetamine (ADDERALL XR) 15 MG 24 hr capsule    Sig: Take 1 capsule by mouth every morning.    Dispense:  30 capsule    Refill:  0    Titrating dose  refilled medication per patient request,, she was given 14 tablets of Adderall XR to start medication on 06/14/19 and reports she is doing well with it. Mar Daring, PA-C is out of the office. Refilled as above patient was going out of town in two days.

## 2019-07-26 ENCOUNTER — Ambulatory Visit (INDEPENDENT_AMBULATORY_CARE_PROVIDER_SITE_OTHER): Payer: Managed Care, Other (non HMO) | Admitting: Dermatology

## 2019-07-26 ENCOUNTER — Other Ambulatory Visit: Payer: Self-pay

## 2019-07-26 DIAGNOSIS — L905 Scar conditions and fibrosis of skin: Secondary | ICD-10-CM

## 2019-07-26 DIAGNOSIS — L988 Other specified disorders of the skin and subcutaneous tissue: Secondary | ICD-10-CM

## 2019-07-26 NOTE — Progress Notes (Signed)
New Patient Visit  Subjective  Meghan [name] is a 49 y.o. female who presents for the following: Facial Elastosis (patient would like to discuss cosmetic procedures like Botox and fillers).  The following portions of the chart were reviewed this encounter and updated as appropriate:  Tobacco  Allergies  Meds  Problems  Med Hx  Surg Hx  Fam Hx     Review of Systems:  No other skin or systemic complaints except as noted in HPI or Assessment and Plan.  Objective  Well appearing patient in no apparent distress; mood and affect are within normal limits.  A focused examination was performed including the face. Relevant physical exam findings are noted in the Assessment and Plan.  Objective  Face: Rhytides and volume loss.   Images                                 Assessment & Plan    Elastosis of skin Face  Discussed Botox and fillers in detail.   Botox 28 units injected as marked: - Frown complex 20 units - DAO's 4 units each for a total of 8 units  Voluma 1 cc injected into the B/L mid face   Botox Injection - Face Location: See attached image  Informed consent: Discussed risks (infection, pain, bleeding, bruising, swelling, allergic reaction, paralysis of nearby muscles, eyelid droop, double vision, neck weakness, difficulty breathing, headache, undesirable cosmetic result, and need for additional treatment) and benefits of the procedure, as well as the alternatives.  Informed consent was obtained.  Preparation: The area was cleansed with alcohol.  Procedure Details:  Botox was injected into the dermis with a 30-gauge needle. Pressure applied to any bleeding. Ice packs offered for swelling.  Lot Number:  [id number] Expiration:  11/2021  Total Units Injected:  28  Plan: Patient was instructed to remain upright for 4 hours. Patient was instructed to avoid massaging the face and avoid vigorous exercise for the rest of the day.  Tylenol may be used for headache.  Allow 2 weeks before returning to clinic for additional dosing as needed. Patient will call for any problems.   Filling material injection - Face Prior to the procedure, the patient's past medical history, allergies and the rare but potential risks and complications were reviewed with the patient and a signed consent was obtained. Pre and post-treatment care was discussed and instructions provided.  Location: cheeks  Filler Type: Juvederm Voluma  Procedure: The area was prepped thoroughly with hibiclens The area was prepped thoroughly with Puracyn. After introducing the needle into the desired treatment area, the syringe plunger was drawn back to ensure there was no flash of blood prior to injecting the filler in order to minimize risk of intravascular injection and vascular occlusion. After injection of the filler, the treated areas were cleansed and iced to reduce swelling. Post-treatment instructions were reviewed with the patient.       Patient tolerated the procedure well. The patient will call with any problems, questions or concerns prior to their next appointment.   Scar L cheek  From trauma as a child - Discussed subcision, saline injections, and fillers.  Return in about 3 months (around 10/26/2019) for Botox and filler - schedule on Wednesday or Thursday .  Luther Redo, CMA, am acting as scribe for Sarina Ser, MD .   Documentation: I have reviewed the above documentation for accuracy and completeness, and I  agree with the above.  Sarina Ser, MD

## 2019-07-27 ENCOUNTER — Encounter: Payer: Self-pay | Admitting: Physician Assistant

## 2019-07-28 NOTE — Telephone Encounter (Signed)
Can we call to see if we can schedule her next week for the itching and rash please.

## 2019-07-30 ENCOUNTER — Encounter: Payer: Self-pay | Admitting: Dermatology

## 2019-08-03 ENCOUNTER — Other Ambulatory Visit: Payer: Self-pay

## 2019-08-03 ENCOUNTER — Encounter: Payer: Self-pay | Admitting: Physician Assistant

## 2019-08-03 ENCOUNTER — Ambulatory Visit: Payer: Managed Care, Other (non HMO) | Admitting: Physician Assistant

## 2019-08-03 VITALS — BP 115/80 | HR 92 | Temp 97.0°F | Resp 16 | Wt 181.0 lb

## 2019-08-03 DIAGNOSIS — R21 Rash and other nonspecific skin eruption: Secondary | ICD-10-CM | POA: Diagnosis not present

## 2019-08-03 DIAGNOSIS — M3500 Sicca syndrome, unspecified: Secondary | ICD-10-CM

## 2019-08-03 MED ORDER — PREDNISONE 5 MG PO TABS: 5.0000 mg | ORAL_TABLET | Freq: Every day | ORAL | 1 refills | Status: DC

## 2019-08-03 NOTE — Progress Notes (Signed)
Established patient visit   Patient: Meghan [name]   DOB: 1970/08/18   49 y.o. Female  MRN: [id number] Visit Date: 08/03/2019  Today's healthcare provider: Mar Daring, PA-C   Chief Complaint  Patient presents with   Rash   Subjective    HPI  Patient here with c/o rash under her left arm.Reports that she went to urgent care and they put her on the Prednisone. She feels like is coming from whatever auto immune disease she is having. She is seeing a new rheumatologist until September.   Patient Active Problem List   Diagnosis Date Noted   Rash 08/03/2019   Sjogren's syndrome ([place]) 08/03/2019   Heart palpitations 07/10/2015   Allergic rhinitis 03/04/2015   Attention deficit disorder 03/04/2015   Chronic bronchitis ([place]) 03/04/2015   Acid reflux 03/04/2015   Panic attack 03/04/2015   Tobacco abuse 03/04/2015   Ovarian cyst, left 04/18/2014   Fatigue 07/29/2009   Clinical depression 12/26/2007   Family history of cardiovascular disease 12/26/2007   Family history of breast cancer 12/26/2007   Polycythemia, secondary 12/26/2007   Past Medical History:  Diagnosis Date   Anxiety    Depression    Fatigue    GERD (gastroesophageal reflux disease)    PMB (postmenopausal bleeding)    RA (rheumatoid arthritis) ([place])    rheumotologist-- dr aRosanna Randy [facility]ology clinic)-- seropostivie RA, positive ANA       Medications: Outpatient Medications Prior to Visit  Medication Sig   amphetamine-dextroamphetamine (ADDERALL XR) 15 MG 24 hr capsule Take 1 capsule by mouth every morning.   clonazePAM (KLONOPIN) 1 MG tablet TAKE 1 TABLET BY MOUTH EVERY DAY (Patient taking differently: daily as needed. TAKE 1 TABLET BY MOUTH EVERY DAY)   cyclobenzaprine (FLEXERIL) 5 MG tablet Take 1 tablet (5 mg total) by mouth 3 (three) times daily as needed for muscle spasms.   ibuprofen (IBU) 800 MG tablet Take 1 tablet (800 mg total) by mouth every 8  (eight) hours as needed. (Patient taking differently: Take 800 mg by mouth as needed. )   meclizine (ANTIVERT) 25 MG tablet Take 1 tablet (25 mg total) by mouth 3 (three) times daily as needed for dizziness.   ondansetron (ZOFRAN) 4 MG tablet Take 1 tablet (4 mg total) by mouth every 8 (eight) hours as needed. (Patient taking differently: Take 4 mg by mouth every 8 (eight) hours as needed. )   oxyCODONE-acetaminophen (PERCOCET/ROXICET) 5-325 MG tablet Take 1 tablet by mouth every 4 (four) hours as needed for severe pain. (Patient taking differently: Take 1 tablet by mouth every 4 (four) hours as needed for severe pain. )   acetaminophen (TYLENOL) 500 MG tablet Take 1,000 mg by mouth every 6 (six) hours as needed.   No facility-administered medications prior to visit.    Review of Systems  Constitutional: Negative.   Respiratory: Negative.   Cardiovascular: Negative.   Skin: Positive for rash (resolved with recent prednisone).  Neurological: Negative.     Last CBC Lab Results  Component Value Date   WBC 5.1 08/10/2018   HGB 15.0 08/10/2018   HCT 44.5 08/10/2018   MCV 87 08/10/2018   MCH 29.2 08/10/2018   RDW 13.1 08/10/2018   PLT 258 15/40/0867   Last metabolic panel Lab Results  Component Value Date   GLUCOSE 86 08/10/2018   NA 138 08/10/2018   K 4.3 08/10/2018   CL 102 08/10/2018   CO2 22 05/20/2016   BUN  13 08/10/2018   CREATININE 0.78 08/10/2018   GFRNONAA 90 08/10/2018   GFRAA 104 08/10/2018   CALCIUM 9.1 08/10/2018   PROT 8.3 08/10/2018   ALBUMIN 4.1 08/10/2018   LABGLOB 4.2 08/10/2018   AGRATIO 1.0 (L) 08/10/2018   BILITOT 0.3 08/10/2018   ALKPHOS 93 08/10/2018   AST 20 08/10/2018   ALT 25 05/20/2016      Objective    BP 115/80 (BP Location: Right Arm, Patient Position: Sitting, Cuff Size: Normal)    Pulse 92    Temp (!) 97 F (36.1 C) (Temporal)    Resp 16    Wt 181 lb (82.1 kg)    LMP 08/13/2014 Comment: abalation   BMI 29.21 kg/m  BP Readings from  Last 3 Encounters:  08/03/19 115/80  06/08/19 110/77  09/23/18 101/69   Wt Readings from Last 3 Encounters:  08/03/19 181 lb (82.1 kg)  06/08/19 185 lb 9.6 oz (84.2 kg)  09/23/18 188 lb (85.3 kg)      Physical Exam Vitals reviewed.  Constitutional:      General: She is not in acute distress.    Appearance: Normal appearance. She is well-developed, well-groomed and overweight. She is not ill-appearing or diaphoretic.  Cardiovascular:     Rate and Rhythm: Normal rate and regular rhythm.     Heart sounds: Normal heart sounds. No murmur heard.  No friction rub. No gallop.   Pulmonary:     Effort: Pulmonary effort is normal. No respiratory distress.     Breath sounds: Normal breath sounds. No wheezing or rales.  Musculoskeletal:     Cervical back: Normal range of motion and neck supple.  Skin:    General: Skin is warm and dry.     Findings: No rash.  Neurological:     Mental Status: She is alert.  Psychiatric:        Behavior: Behavior is cooperative.       No results found for any visits on 08/03/19.  Assessment & Plan     1. Rash Resolved currently.   2. Sjogren's syndrome, with unspecified organ involvement ([place]) Known diagnosis. Has had other rheumatologist tell her she has lupus and one say she had RA then had psoriatic arthritis. She is seeing a third rheumatologist as she has not had a direct answer but continues to have multiple joint pains and joint swelling as well as rashes without a true diagnosis. Will continue the low dose prednisone as this has helped her in the past, previously prescribed by Rheum. She is aware of risk associated with long-term steroid use and chooses to continue this at least until Sept when she establishes with the new rheumatologist.  - predniSONE (DELTASONE) 5 MG tablet; Take 1 tablet (5 mg total) by mouth daily with breakfast.  Dispense: 90 tablet; Refill: 1   No follow-ups on file.      Reynolds Bowl, PA-C, have reviewed  all documentation for this visit. The documentation on 08/08/19 for the exam, diagnosis, procedures, and orders are all accurate and complete.   Rubye Beach  [facility] [phone] (phone) [phone] (fax)  [place]

## 2019-08-08 ENCOUNTER — Encounter: Payer: Self-pay | Admitting: Physician Assistant

## 2019-08-09 ENCOUNTER — Encounter: Payer: Self-pay | Admitting: Physician Assistant

## 2019-08-09 DIAGNOSIS — F900 Attention-deficit hyperactivity disorder, predominantly inattentive type: Secondary | ICD-10-CM

## 2019-08-10 MED ORDER — AMPHETAMINE-DEXTROAMPHET ER 15 MG PO CP24: 15.0000 mg | ORAL_CAPSULE | ORAL | 0 refills | Status: DC

## 2019-08-25 ENCOUNTER — Other Ambulatory Visit: Payer: Self-pay | Admitting: Physician Assistant

## 2019-08-25 DIAGNOSIS — S39012A Strain of muscle, fascia and tendon of lower back, initial encounter: Secondary | ICD-10-CM

## 2019-08-25 NOTE — Telephone Encounter (Signed)
Requested  medications are  due for refill today yes  Requested medications are on the active medication list yes  Last refill 7/9  Last visit July 2021  Future visit scheduled No  Notes to clinic Not sure if this was to be continued.

## 2019-08-28 ENCOUNTER — Encounter: Payer: Self-pay | Admitting: Physician Assistant

## 2019-08-29 NOTE — Progress Notes (Signed)
Complete physical exam   Patient: Meghan [name]   DOB: [birth year]   49 y.o. Female  MRN: [id number] Visit Date: 08/31/2019  Today's healthcare provider: Mar Daring, PA-C   Chief Complaint  Patient presents with  . Annual Exam   Subjective    Meghan [name] is a 49 y.o. female who presents today for a complete physical exam.  She reports consuming a general diet. Gym/ health club routine includes cardio. She generally feels well. She reports sleeping well. She does not have additional problems to discuss today.  HPI Patient need Health screening form to be fill out. Last Pap:12/17/16-Negative, HPV-Negative Mammogram: 06/05/19 Normal. Repeat in one year.  Of social note: patient is going through a separation. Overall she is feeling ok, but does note she is smoking a little more at this time.  Past Medical History:  Diagnosis Date  . Anxiety   . Depression   . Fatigue   . GERD (gastroesophageal reflux disease)   . PMB (postmenopausal bleeding)   . RA (rheumatoid arthritis) ([place])    rheumotologist-- dr aRosanna Randy [facility] rheumatology clinic)-- seropostivie RA, positive ANA   Past Surgical History:  Procedure Laterality Date  . CHOLECYSTECTOMY OPEN  1987   W/  APPENDECTOMY  . HYSTEROSCOPY W/ ENDOMETRIAL ABLATION  2004   W/  LAPAROSCOPIC BILATERAL TUBAL LIGATION  . HYSTEROSCOPY WITH D & C N/A 06/22/2018   Procedure: DILATATION AND CURETTAGE /HYSTEROSCOPY;  Surgeon: Jerelyn Charles, MD;  Location: [place];  Service: Gynecology;  Laterality: N/A;  . LAPAROSCOPIC UNILATERAL SALPINGECTOMY Left 2002   ECTOPIC   Social History   Socioeconomic History  . Marital status: Married    Spouse name: Not on file  . Number of children: Not on file  . Years of education: Not on file  . Highest education level: Not on file  Occupational History  . Not on file  Tobacco Use  . Smoking status: Current Every Day Smoker    Packs/day: 1.00    Years:  25.00    Pack years: 25.00    Types: Cigarettes  . Smokeless tobacco: Never Used  Vaping Use  . Vaping Use: Never used  Substance and Sexual Activity  . Alcohol use: Yes    Comment: social  . Drug use: Never  . Sexual activity: Yes    Partners: Male    Birth control/protection: Surgical, Post-menopausal  Other Topics Concern  . Not on file  Social History Narrative   Married, full time, gets regular exercise.    Social Determinants of Health   Financial Resource Strain:   . Difficulty of Paying Living Expenses: Not on file  Food Insecurity:   . Worried About Charity fundraiser in the Last Year: Not on file  . Ran Out of Food in the Last Year: Not on file  Transportation Needs:   . Lack of Transportation (Medical): Not on file  . Lack of Transportation (Non-Medical): Not on file  Physical Activity:   . Days of Exercise per Week: Not on file  . Minutes of Exercise per Session: Not on file  Stress:   . Feeling of Stress : Not on file  Social Connections:   . Frequency of Communication with Friends and Family: Not on file  . Frequency of Social Gatherings with Friends and Family: Not on file  . Attends Religious Services: Not on file  . Active Member of Clubs or Organizations: Not on file  .  Attends Archivist Meetings: Not on file  . Marital Status: Not on file  Intimate Partner Violence:   . Fear of Current or Ex-Partner: Not on file  . Emotionally Abused: Not on file  . Physically Abused: Not on file  . Sexually Abused: Not on file   Family Status  Relation Name Status  . Father  Deceased  . Mother  Alive  . Sister  Alive  . Brother  Deceased  . Sister  Alive  . Mat Aunt  Deceased  . Cousin  (Not Specified)  . MGM  Deceased  . MGF  Deceased  . PGM  Deceased  . PGF  Deceased  . Mat Uncle  (Not Specified)   Family History  Problem Relation Age of Onset  . Heart attack Father 52       HAD cabg, HAD DEFIBRILLATOR   . Diabetes Father   .  Depression Father   . Heart disease Father   . Bladder Cancer Father        deceased at 36  . Hyperlipidemia Mother   . Breast cancer Mother 86       TAH/BSO in late 20s; currently 49yo  . Anxiety disorder Sister   . Schizophrenia Brother   . Anxiety disorder Brother   . Anxiety disorder Sister   . Breast cancer Maternal Aunt        deceased in 50s/60s  . Breast cancer Cousin        2 first cousins, maternal  . Lymphoma Maternal Uncle        deceased   Allergies  Allergen Reactions  . Contrast Media [Iodinated Diagnostic Agents] Hives  . Sulfa Antibiotics Swelling    Facial swelling    Patient Care Team: Mar Daring, PA-C as PCP - General (Family Medicine)   Medications: Outpatient Medications Prior to Visit  Medication Sig  . amphetamine-dextroamphetamine (ADDERALL XR) 15 MG 24 hr capsule Take 1 capsule by mouth every morning.  . clonazePAM (KLONOPIN) 1 MG tablet TAKE 1 TABLET BY MOUTH EVERY DAY (Patient taking differently: daily as needed. TAKE 1 TABLET BY MOUTH EVERY DAY)  . cyclobenzaprine (FLEXERIL) 5 MG tablet Take 1 tablet (5 mg total) by mouth 3 (three) times daily as needed for muscle spasms.  Marland Kitchen ibuprofen (ADVIL) 800 MG tablet TAKE 1 TABLET BY MOUTH EVERY 8 HOURS AS NEEDED  . meclizine (ANTIVERT) 25 MG tablet Take 1 tablet (25 mg total) by mouth 3 (three) times daily as needed for dizziness.  . ondansetron (ZOFRAN) 4 MG tablet Take 1 tablet (4 mg total) by mouth every 8 (eight) hours as needed. (Patient taking differently: Take 4 mg by mouth every 8 (eight) hours as needed. )  . oxyCODONE-acetaminophen (PERCOCET/ROXICET) 5-325 MG tablet Take 1 tablet by mouth every 4 (four) hours as needed for severe pain. (Patient taking differently: Take 1 tablet by mouth every 4 (four) hours as needed for severe pain. )  . predniSONE (DELTASONE) 5 MG tablet Take 1 tablet (5 mg total) by mouth daily with breakfast.   No facility-administered medications prior to visit.     Review of Systems  Constitutional: Negative.   HENT: Negative.   Eyes: Negative.   Respiratory: Negative.   Cardiovascular: Negative.   Gastrointestinal: Negative.   Endocrine: Negative.   Genitourinary: Negative.   Musculoskeletal: Negative.   Skin: Negative.   Allergic/Immunologic: Negative.   Neurological: Negative.   Hematological: Negative.   Psychiatric/Behavioral: Negative.     Last  CBC Lab Results  Component Value Date   WBC 7.4 08/31/2019   HGB 16.0 (H) 08/31/2019   HCT 45.8 08/31/2019   MCV 84 08/31/2019   MCH 29.5 08/31/2019   RDW 12.6 08/31/2019   PLT 212 55/73/2202   Last metabolic panel Lab Results  Component Value Date   GLUCOSE 97 08/31/2019   NA 136 08/31/2019   K 4.0 08/31/2019   CL 96 08/31/2019   CO2 25 08/31/2019   BUN 8 08/31/2019   CREATININE 0.83 08/31/2019   GFRNONAA 83 08/31/2019   GFRAA 96 08/31/2019   CALCIUM 9.8 08/31/2019   PROT 8.9 (H) 08/31/2019   ALBUMIN 4.4 08/31/2019   LABGLOB 4.5 08/31/2019   AGRATIO 1.0 (L) 08/31/2019   BILITOT 0.5 08/31/2019   ALKPHOS 78 08/31/2019   AST 20 08/31/2019   ALT 28 08/31/2019      Objective    BP 102/68   Pulse 95   Temp 98.1 F (36.7 C) (Oral)   Resp 16   Ht 5\' 6"  (1.676 m)   Wt 168 lb 12.8 oz (76.6 kg)   LMP 08/13/2014 Comment: abalation  SpO2 97%   BMI 27.25 kg/m  BP Readings from Last 3 Encounters:  08/31/19 102/68  08/03/19 115/80  06/08/19 110/77   Wt Readings from Last 3 Encounters:  08/31/19 168 lb 12.8 oz (76.6 kg)  08/03/19 181 lb (82.1 kg)  06/08/19 185 lb 9.6 oz (84.2 kg)      Physical Exam Vitals reviewed.  Constitutional:      General: She is not in acute distress.    Appearance: Normal appearance. She is well-developed. She is not ill-appearing or diaphoretic.  HENT:     Head: Normocephalic and atraumatic.     Right Ear: Tympanic membrane, ear canal and external ear normal.     Left Ear: Tympanic membrane, ear canal and external ear normal.      Nose: Nose normal.     Mouth/Throat:     Mouth: Mucous membranes are moist.     Pharynx: Oropharynx is clear. No oropharyngeal exudate.  Eyes:     General: No scleral icterus.       Right eye: No discharge.        Left eye: No discharge.     Extraocular Movements: Extraocular movements intact.     Conjunctiva/sclera: Conjunctivae normal.     Pupils: Pupils are equal, round, and reactive to light.  Neck:     Thyroid: No thyromegaly.     Vascular: No JVD.     Trachea: No tracheal deviation.  Cardiovascular:     Rate and Rhythm: Normal rate and regular rhythm.     Pulses: Normal pulses.     Heart sounds: Normal heart sounds. No murmur heard.  No friction rub. No gallop.   Pulmonary:     Effort: Pulmonary effort is normal. No respiratory distress.     Breath sounds: Normal breath sounds. No wheezing or rales.  Chest:     Chest wall: No tenderness.  Abdominal:     General: Abdomen is flat. Bowel sounds are normal. There is no distension.     Palpations: Abdomen is soft. There is no mass.     Tenderness: There is no abdominal tenderness. There is no guarding or rebound.  Musculoskeletal:        General: No tenderness. Normal range of motion.     Cervical back: Normal range of motion and neck supple.     Right lower  leg: No edema.     Left lower leg: No edema.  Lymphadenopathy:     Cervical: No cervical adenopathy.  Skin:    General: Skin is warm and dry.     Capillary Refill: Capillary refill takes less than 2 seconds.     Findings: No rash.  Neurological:     General: No focal deficit present.     Mental Status: She is alert and oriented to person, place, and time. Mental status is at baseline.  Psychiatric:        Mood and Affect: Mood normal.        Behavior: Behavior normal.        Thought Content: Thought content normal.        Judgment: Judgment normal.       Last depression screening scores PHQ 2/9 Scores 05/20/2016  PHQ - 2 Score 0  PHQ- 9 Score 3   Last fall  risk screening Fall Risk  05/20/2016  Falls in the past year? No   Last Audit-C alcohol use screening No flowsheet data found. A score of 3 or more in women, and 4 or more in men indicates increased risk for alcohol abuse, EXCEPT if all of the points are from question 1   No results found for any visits on 08/31/19.  Assessment & Plan    Routine Health Maintenance and Physical Exam  Exercise Activities and Dietary recommendations Goals    . Quit smoking / using tobacco       Immunization History  Administered Date(s) Administered  . Influenza,inj,Quad PF,6+ Mos 10/29/2017    Health Maintenance  Topic Date Due  . Hepatitis C Screening  Never done  . COVID-19 Vaccine (1) Never done  . HIV Screening  Never done  . TETANUS/TDAP  Never done  . INFLUENZA VACCINE  08/13/2019  . PAP SMEAR-Modifier  12/17/2021    Discussed health benefits of physical activity, and encouraged her to engage in regular exercise appropriate for her age and condition.  1. Annual physical exam Normal physical exam today. Will check labs as below and f/u pending lab results. If labs are stable and WNL she will not need to have these rechecked for one year at her next annual physical exam. She is to call the office in the meantime if she has any acute issue, questions or concerns. - CBC with Differential/Platelet - Comprehensive metabolic panel - Hemoglobin A1c - TSH  2. Attention deficit hyperactivity disorder (ADHD), predominantly inattentive type Stable. Continue Adderall XR 15mg  daily.   3. Encounter for biometric screening Will check labs as below and f/u pending results. - CBC with Differential/Platelet - Comprehensive metabolic panel - Hemoglobin A1c - TSH - SARS-CoV-2 Semi-Quantitative Total Antibody, Spike  4. Encounter for hepatitis C screening test for low risk patient Will check labs as below and f/u pending results. - Hepatitis C antibody  5. HIV screening declined Will check labs  as below and f/u pending results. - HIV Antibody (routine testing w rflx)   No follow-ups on file.     Reynolds Bowl, PA-C, have reviewed all documentation for this visit. The documentation on 09/05/19 for the exam, diagnosis, procedures, and orders are all accurate and complete.   Rubye Beach  [facility] [phone] (phone) [phone] (fax)  [place]

## 2019-08-31 ENCOUNTER — Other Ambulatory Visit: Payer: Self-pay

## 2019-08-31 ENCOUNTER — Ambulatory Visit (INDEPENDENT_AMBULATORY_CARE_PROVIDER_SITE_OTHER): Payer: Managed Care, Other (non HMO) | Admitting: Physician Assistant

## 2019-08-31 ENCOUNTER — Encounter: Payer: Self-pay | Admitting: Physician Assistant

## 2019-08-31 VITALS — BP 102/68 | HR 95 | Temp 98.1°F | Resp 16 | Ht 66.0 in | Wt 168.8 lb

## 2019-08-31 DIAGNOSIS — F900 Attention-deficit hyperactivity disorder, predominantly inattentive type: Secondary | ICD-10-CM | POA: Diagnosis not present

## 2019-08-31 DIAGNOSIS — Z1159 Encounter for screening for other viral diseases: Secondary | ICD-10-CM

## 2019-08-31 DIAGNOSIS — Z008 Encounter for other general examination: Secondary | ICD-10-CM

## 2019-08-31 DIAGNOSIS — Z5320 Procedure and treatment not carried out because of patient's decision for unspecified reasons: Secondary | ICD-10-CM

## 2019-08-31 DIAGNOSIS — Z0000 Encounter for general adult medical examination without abnormal findings: Secondary | ICD-10-CM

## 2019-08-31 NOTE — Patient Instructions (Signed)
Health Maintenance, Female Adopting a healthy lifestyle and getting preventive care are important in promoting health and wellness. Ask your health care provider about:  The right schedule for you to have regular tests and exams.  Things you can do on your own to prevent diseases and keep yourself healthy. What should I know about diet, weight, and exercise? Eat a healthy diet   Eat a diet that includes plenty of vegetables, fruits, low-fat dairy products, and lean protein.  Do not eat a lot of foods that are high in solid fats, added sugars, or sodium. Maintain a healthy weight Body mass index (BMI) is used to identify weight problems. It estimates body fat based on height and weight. Your health care provider can help determine your BMI and help you achieve or maintain a healthy weight. Get regular exercise Get regular exercise. This is one of the most important things you can do for your health. Most adults should:  Exercise for at least 150 minutes each week. The exercise should increase your heart rate and make you sweat (moderate-intensity exercise).  Do strengthening exercises at least twice a week. This is in addition to the moderate-intensity exercise.  Spend less time sitting. Even light physical activity can be beneficial. Watch cholesterol and blood lipids Have your blood tested for lipids and cholesterol at 50 years of age, then have this test every 5 years. Have your cholesterol levels checked more often if:  Your lipid or cholesterol levels are high.  You are older than 50 years of age.  You are at high risk for heart disease. What should I know about cancer screening? Depending on your health history and family history, you may need to have cancer screening at various ages. This may include screening for:  Breast cancer.  Cervical cancer.  Colorectal cancer.  Skin cancer.  Lung cancer. What should I know about heart disease, diabetes, and high blood  pressure? Blood pressure and heart disease  High blood pressure causes heart disease and increases the risk of stroke. This is more likely to develop in people who have high blood pressure readings, are of African descent, or are overweight.  Have your blood pressure checked: ? Every 3-5 years if you are 18-39 years of age. ? Every year if you are 40 years old or older. Diabetes Have regular diabetes screenings. This checks your fasting blood sugar level. Have the screening done:  Once every three years after age 40 if you are at a normal weight and have a low risk for diabetes.  More often and at a younger age if you are overweight or have a high risk for diabetes. What should I know about preventing infection? Hepatitis B If you have a higher risk for hepatitis B, you should be screened for this virus. Talk with your health care provider to find out if you are at risk for hepatitis B infection. Hepatitis C Testing is recommended for:  Everyone born from 1945 through 1965.  Anyone with known risk factors for hepatitis C. Sexually transmitted infections (STIs)  Get screened for STIs, including gonorrhea and chlamydia, if: ? You are sexually active and are younger than 50 years of age. ? You are older than 50 years of age and your health care provider tells you that you are at risk for this type of infection. ? Your sexual activity has changed since you were last screened, and you are at increased risk for chlamydia or gonorrhea. Ask your health care provider if   you are at risk.  Ask your health care provider about whether you are at high risk for HIV. Your health care provider may recommend a prescription medicine to help prevent HIV infection. If you choose to take medicine to prevent HIV, you should first get tested for HIV. You should then be tested every 3 months for as long as you are taking the medicine. Pregnancy  If you are about to stop having your period (premenopausal) and  you may become pregnant, seek counseling before you get pregnant.  Take 400 to 800 micrograms (mcg) of folic acid every day if you become pregnant.  Ask for birth control (contraception) if you want to prevent pregnancy. Osteoporosis and menopause Osteoporosis is a disease in which the bones lose minerals and strength with aging. This can result in bone fractures. If you are 65 years old or older, or if you are at risk for osteoporosis and fractures, ask your health care provider if you should:  Be screened for bone loss.  Take a calcium or vitamin D supplement to lower your risk of fractures.  Be given hormone replacement therapy (HRT) to treat symptoms of menopause. Follow these instructions at home: Lifestyle  Do not use any products that contain nicotine or tobacco, such as cigarettes, e-cigarettes, and chewing tobacco. If you need help quitting, ask your health care provider.  Do not use street drugs.  Do not share needles.  Ask your health care provider for help if you need support or information about quitting drugs. Alcohol use  Do not drink alcohol if: ? Your health care provider tells you not to drink. ? You are pregnant, may be pregnant, or are planning to become pregnant.  If you drink alcohol: ? Limit how much you use to 0-1 drink a day. ? Limit intake if you are breastfeeding.  Be aware of how much alcohol is in your drink. In the [place] one drink equals one 12 oz bottle of beer (355 mL), one 5 oz glass of wine (148 mL), or one 1 oz glass of hard liquor (44 mL). General instructions  Schedule regular health, dental, and eye exams.  Stay current with your vaccines.  Tell your health care provider if: ? You often feel depressed. ? You have ever been abused or do not feel safe at home. Summary  Adopting a healthy lifestyle and getting preventive care are important in promoting health and wellness.  Follow your health care provider's instructions about healthy  diet, exercising, and getting tested or screened for diseases.  Follow your health care provider's instructions on monitoring your cholesterol and blood pressure. This information is not intended to replace advice given to you by your health care provider. Make sure you discuss any questions you have with your health care provider. Document Revised: 12/22/2017 Document Reviewed: 12/22/2017 Elsevier Patient Education  2020 Elsevier Inc.  

## 2019-09-01 ENCOUNTER — Ambulatory Visit: Payer: Managed Care, Other (non HMO) | Admitting: Physician Assistant

## 2019-09-02 LAB — COMPREHENSIVE METABOLIC PANEL
ALT: 28 IU/L (ref 0–32)
AST: 20 IU/L (ref 0–40)
Albumin/Globulin Ratio: 1 — ABNORMAL LOW (ref 1.2–2.2)
Albumin: 4.4 g/dL (ref 3.8–4.8)
Alkaline Phosphatase: 78 IU/L (ref 48–121)
BUN/Creatinine Ratio: 10 (ref 9–23)
BUN: 8 mg/dL (ref 6–24)
Bilirubin Total: 0.5 mg/dL (ref 0.0–1.2)
CO2: 25 mmol/L (ref 20–29)
Calcium: 9.8 mg/dL (ref 8.7–10.2)
Chloride: 96 mmol/L (ref 96–106)
Creatinine, Ser: 0.83 mg/dL (ref 0.57–1.00)
GFR calc Af Amer: 96 mL/min/{1.73_m2} (ref >59)
GFR calc non Af Amer: 83 mL/min/{1.73_m2} (ref >59)
Globulin, Total: 4.5 g/dL (ref 1.5–4.5)
Glucose: 97 mg/dL (ref 65–99)
Potassium: 4 mmol/L (ref 3.5–5.2)
Sodium: 136 mmol/L (ref 134–144)
Total Protein: 8.9 g/dL — ABNORMAL HIGH (ref 6.0–8.5)

## 2019-09-02 LAB — CBC WITH DIFFERENTIAL/PLATELET
Basophils Absolute: 0 10*3/uL (ref 0.0–0.2)
Basos: 0 %
EOS (ABSOLUTE): 0 10*3/uL (ref 0.0–0.4)
Eos: 0 %
Hematocrit: 45.8 % (ref 34.0–46.6)
Hemoglobin: 16 g/dL — ABNORMAL HIGH (ref 11.1–15.9)
Immature Grans (Abs): 0 10*3/uL (ref 0.0–0.1)
Immature Granulocytes: 0 %
Lymphocytes Absolute: 0.9 10*3/uL (ref 0.7–3.1)
Lymphs: 12 %
MCH: 29.5 pg (ref 26.6–33.0)
MCHC: 34.9 g/dL (ref 31.5–35.7)
MCV: 84 fL (ref 79–97)
Monocytes Absolute: 0.3 10*3/uL (ref 0.1–0.9)
Monocytes: 4 %
Neutrophils Absolute: 6.1 10*3/uL (ref 1.4–7.0)
Neutrophils: 84 %
Platelets: 212 10*3/uL (ref 150–450)
RBC: 5.43 x10E6/uL — ABNORMAL HIGH (ref 3.77–5.28)
RDW: 12.6 % (ref 11.7–15.4)
WBC: 7.4 10*3/uL (ref 3.4–10.8)

## 2019-09-02 LAB — LIPID PANEL
Chol/HDL Ratio: 4.9 ratio — ABNORMAL HIGH (ref 0.0–4.4)
Cholesterol, Total: 188 mg/dL (ref 100–199)
HDL: 38 mg/dL — ABNORMAL LOW (ref >39)
LDL Chol Calc (NIH): 120 mg/dL — ABNORMAL HIGH (ref 0–99)
Triglycerides: 166 mg/dL — ABNORMAL HIGH (ref 0–149)
VLDL Cholesterol Cal: 30 mg/dL (ref 5–40)

## 2019-09-02 LAB — HEMOGLOBIN A1C
Est. average glucose Bld gHb Est-mCnc: 100 mg/dL
Hgb A1c MFr Bld: 5.1 % (ref 4.8–5.6)

## 2019-09-02 LAB — SARS-COV-2 SEMI-QUANTITATIVE TOTAL ANTIBODY, SPIKE
SARS-CoV-2 Semi-Quant Total Ab: 938.5 U/mL (ref <0.8)
SARS-CoV-2 Spike Ab Interp: POSITIVE

## 2019-09-02 LAB — SPECIMEN STATUS REPORT

## 2019-09-02 LAB — TSH: TSH: 1.26 u[IU]/mL (ref 0.450–4.500)

## 2019-09-02 LAB — HIV ANTIBODY (ROUTINE TESTING W REFLEX): HIV Screen 4th Generation wRfx: NONREACTIVE

## 2019-09-02 LAB — HEPATITIS C ANTIBODY: Hep C Virus Ab: <0.1 s/co ratio (ref 0.0–0.9)

## 2019-09-04 ENCOUNTER — Telehealth: Payer: Self-pay

## 2019-09-04 NOTE — Telephone Encounter (Signed)
Seen by patient Meghan [name] on 09/04/2019 12:38 PM

## 2019-09-04 NOTE — Telephone Encounter (Signed)
-----   Message from Mar Daring, [place] sent at 09/04/2019 12:16 PM EDT ----- Blood count is ok. Hemoglobin is slightly increased but this is common with the increased smoking acutely at this time. Kidney and liver function is normal. Sodium, potassium, and calcium are normal. A1c/sugar are normal. Thyroid is normal. Covid antibodies are present. Hep C screen is negative. HIV screen is negative. Cholesterol has improved slightly compared to last year. Continue healthy lifestyle modifications and continue working on quitting smoking. Forms completed and will be faxed.

## 2019-09-05 ENCOUNTER — Encounter: Payer: Self-pay | Admitting: Physician Assistant

## 2019-09-06 ENCOUNTER — Encounter: Payer: Self-pay | Admitting: Physician Assistant

## 2019-09-06 DIAGNOSIS — F900 Attention-deficit hyperactivity disorder, predominantly inattentive type: Secondary | ICD-10-CM

## 2019-09-06 MED ORDER — AMPHETAMINE-DEXTROAMPHET ER 15 MG PO CP24: 15.0000 mg | ORAL_CAPSULE | ORAL | 0 refills | Status: DC

## 2019-09-12 ENCOUNTER — Encounter: Payer: Self-pay | Admitting: Physician Assistant

## 2019-09-14 NOTE — Telephone Encounter (Signed)
Form has been completed. Can we email back to her please?  JB

## 2019-09-19 ENCOUNTER — Encounter: Payer: Self-pay | Admitting: Physician Assistant

## 2019-09-19 DIAGNOSIS — T3695XA Adverse effect of unspecified systemic antibiotic, initial encounter: Secondary | ICD-10-CM

## 2019-09-19 DIAGNOSIS — B379 Candidiasis, unspecified: Secondary | ICD-10-CM

## 2019-09-19 DIAGNOSIS — N3000 Acute cystitis without hematuria: Secondary | ICD-10-CM

## 2019-09-19 MED ORDER — NITROFURANTOIN MONOHYD MACRO 100 MG PO CAPS: 100.0000 mg | ORAL_CAPSULE | Freq: Two times a day (BID) | ORAL | 0 refills | Status: DC

## 2019-09-19 MED ORDER — FLUCONAZOLE 150 MG PO TABS: 150.0000 mg | ORAL_TABLET | Freq: Once | ORAL | 0 refills | Status: AC

## 2019-09-25 ENCOUNTER — Encounter: Payer: Self-pay | Admitting: Physician Assistant

## 2019-09-25 DIAGNOSIS — F411 Generalized anxiety disorder: Secondary | ICD-10-CM

## 2019-09-25 MED ORDER — LORAZEPAM 0.5 MG PO TABS: 0.5000 mg | ORAL_TABLET | Freq: Every day | ORAL | 0 refills | Status: DC

## 2019-09-25 MED ORDER — CLONAZEPAM 1 MG PO TABS: 1.0000 mg | ORAL_TABLET | Freq: Every day | ORAL | 0 refills | Status: DC

## 2019-09-25 NOTE — Addendum Note (Signed)
Addended by: Mar Daring on: 09/25/2019 04:15 PM   Modules accepted: Orders

## 2019-09-28 MED ORDER — CLONAZEPAM 0.5 MG PO TABS: 0.5000 mg | ORAL_TABLET | Freq: Two times a day (BID) | ORAL | 0 refills | Status: DC | PRN

## 2019-09-28 NOTE — Addendum Note (Signed)
Addended by: Mar Daring on: 09/28/2019 04:17 PM   Modules accepted: Orders

## 2019-09-29 NOTE — Progress Notes (Signed)
Office Visit Note  Patient: Meghan [name]             Date of Birth: 06-18-1970           MRN: [id number]             PCP: Mar Daring, PA-C Referring: Florian Buff* Visit Date: 10/12/2019 Occupation: '@GUAROCC' @  Subjective:  Recurrent rash and muscle pain.   History of Present Illness: Meghan [name] is a 49 y.o. female seen in consultation per request of her PCP.  According to the patient in 2018 she started experiencing increased fatigue and muscle pain in her arms or legs.  She works all the time at [employer] and she thought the symptoms were related to her work.  She was seen by her PCP who did some lab work and her labs came positive for elevated sedimentation rate, positive ANA and positive RF.  She was referred to [facility] clinic where she was seen by rheumatologist and was diagnosed with rheumatoid arthritis.  She was not having any joint swelling.  She was placed on high-dose prednisone and methotrexate.  She states she did start feeling better on prednisone.  The methotrexate she could not take after 1 month due to nausea and fatigue and discontinued.  She was seen at [facility] rheumatology for a second opinion where she was told that she does not have rheumatoid arthritis and was diagnosed with Sjogren's.  She was placed on prednisone 10 mg p.o. daily for 1 year and then discontinued it in June 2021.  In May 2021 she went to the beach and developed a rash on her bilateral lower extremities.  She brought some pictures on her iPhone.  She was tapered off prednisone in June 2021 at the time she developed few spots on the left side of her chest.  She was seen by her PCP and she was placed back on prednisone 5 mg p.o. daily.  She has been taking prednisone since then.  There is no history of joint pain or joint swelling.  She has had history of dry eyes for several years.  She also has occasional dry mouth.  She denies any dry skin or vaginal dryness.  Activities of Daily  Living:  Patient reports morning stiffness for 0 minutes.   Patient Denies nocturnal pain.  Difficulty dressing/grooming: Denies Difficulty climbing stairs: Denies Difficulty getting out of chair: Denies Difficulty using hands for taps, buttons, cutlery, and/or writing: Denies  Review of Systems  Constitutional: Negative for fatigue, night sweats, weight gain and weight loss.  HENT: Positive for mouth dryness and nose dryness. Negative for mouth sores, trouble swallowing and trouble swallowing.   Eyes: Positive for dryness. Negative for pain, redness and visual disturbance.  Respiratory: Negative for cough, shortness of breath, wheezing and difficulty breathing.   Cardiovascular: Negative for chest pain, palpitations, hypertension, irregular heartbeat and swelling in legs/feet.  Gastrointestinal: Negative for blood in stool, constipation and diarrhea.  Endocrine: Negative for increased urination.  Genitourinary: Negative for difficulty urinating, painful urination and vaginal dryness.  Musculoskeletal: Positive for myalgias, muscle tenderness and myalgias. Negative for arthralgias, joint pain, joint swelling, muscle weakness and morning stiffness.  Skin: Negative for color change, rash, hair loss, redness, skin tightness, ulcers and sensitivity to sunlight.  Allergic/Immunologic: Negative for susceptible to infections.  Neurological: Positive for weakness. Negative for dizziness, numbness, headaches, memory loss and night sweats.  Hematological: Negative for bruising/bleeding tendency and swollen glands.  Psychiatric/Behavioral: Negative for  depressed mood, confusion and sleep disturbance. The patient is nervous/anxious.     PMFS History:  Patient Active Problem List   Diagnosis Date Noted  . Rash 08/03/2019  . Anxiety 04/21/2017  . DDD (degenerative disc disease), cervical 03/30/2017  . Seropositive rheumatoid arthritis ([place]) 06/01/2016  . Positive ANA (antinuclear antibody)  06/01/2016  . Heart palpitations 07/10/2015  . Allergic rhinitis 03/04/2015  . Attention deficit disorder 03/04/2015  . Chronic bronchitis ([place]) 03/04/2015  . Acid reflux 03/04/2015  . Panic attack 03/04/2015  . Tobacco consumption 03/04/2015  . Ovarian cyst, left 04/18/2014  . Fatigue 07/29/2009  . Clinical depression 12/26/2007  . Family history of cardiovascular disease 12/26/2007  . Family history of breast cancer 12/26/2007  . Polycythemia, secondary 12/26/2007    Past Medical History:  Diagnosis Date  . Anxiety   . Depression   . Fatigue   . GERD (gastroesophageal reflux disease)   . PMB (postmenopausal bleeding)   . RA (rheumatoid arthritis) ([place])    rheumotologist-- [facility] seropostivie RA, positive ANA    Family History  Problem Relation Age of Onset  . Heart attack Father 64       HAD cabg, HAD DEFIBRILLATOR   . Diabetes Father   . Depression Father   . Heart disease Father   . Bladder Cancer Father        deceased at 66  . Hyperlipidemia Mother   . Breast cancer Mother 40       TAH/BSO in late 20s; currently 49yo  . Autoimmune disease Mother   . Anxiety disorder Sister   . Schizophrenia Brother   . Anxiety disorder Brother   . Anxiety disorder Sister   . Breast cancer Maternal Aunt        deceased in 50s/60s  . Breast cancer Cousin        2 first cousins, maternal  . Lymphoma Maternal Uncle        deceased   Past Surgical History:  Procedure Laterality Date  . CHOLECYSTECTOMY OPEN  1987   W/  APPENDECTOMY  . HYSTEROSCOPY W/ ENDOMETRIAL ABLATION  2004   W/  LAPAROSCOPIC BILATERAL TUBAL LIGATION  . HYSTEROSCOPY WITH D & C N/A 06/22/2018   Procedure: DILATATION AND CURETTAGE /HYSTEROSCOPY;  Surgeon: Jerelyn Charles, MD;  Location: [facility];  Service: Gynecology;  Laterality: N/A;  . LAPAROSCOPIC UNILATERAL SALPINGECTOMY Left 2002   ECTOPIC   Social History   Social History Narrative   Married,  full time, gets regular exercise.    Immunization History  Administered Date(s) Administered  . Influenza,inj,Quad PF,6+ Mos 10/29/2017  . PFIZER SARS-COV-2 Vaccination 03/30/2019, 04/20/2019     Objective: Vital Signs: BP 134/88 (BP Location: Right Arm, Patient Position: Sitting, Cuff Size: Normal)   Pulse 77   Resp 14   Ht '5\' 5"'  (1.651 m)   Wt 163 lb 12.8 oz (74.3 kg)   LMP 08/13/2014 Comment: abalation  BMI 27.26 kg/m    Physical Exam Vitals and nursing note reviewed.  Constitutional:      Appearance: She is well-developed.  HENT:     Head: Normocephalic and atraumatic.  Eyes:     Conjunctiva/sclera: Conjunctivae normal.  Cardiovascular:     Rate and Rhythm: Normal rate and regular rhythm.     Heart sounds: Normal heart sounds.  Pulmonary:     Effort: Pulmonary effort is normal.     Breath sounds: Normal breath sounds.  Abdominal:  General: Bowel sounds are normal.     Palpations: Abdomen is soft.  Musculoskeletal:     Cervical back: Normal range of motion.  Lymphadenopathy:     Cervical: No cervical adenopathy.  Skin:    General: Skin is warm and dry.     Capillary Refill: Capillary refill takes less than 2 seconds.  Neurological:     Mental Status: She is alert and oriented to person, place, and time.  Psychiatric:        Behavior: Behavior normal.      Musculoskeletal Exam: C-spine thoracic and lumbar spine with good range of motion.  She had no SI joint tenderness.  Shoulder joints, elbow joints, wrist joints, MCPs PIPs and DIPs in good range of motion with no synovitis.  Hip joints, knee joints, ankles, MTPs and PIPs with good range of motion with no synovitis.  CDAI Exam: CDAI Score: -- Patient Global: --; Provider Global: -- Swollen: --; Tender: -- Joint Exam 10/12/2019   No joint exam has been documented for this visit   There is currently no information documented on the homunculus. Go to the Rheumatology activity and complete the homunculus  joint exam.  Investigation: No additional findings.  Imaging: No results found.  Recent Labs: Lab Results  Component Value Date   WBC 7.4 08/31/2019   HGB 16.0 (H) 08/31/2019   PLT 212 08/31/2019   NA 136 08/31/2019   K 4.0 08/31/2019   CL 96 08/31/2019   CO2 25 08/31/2019   GLUCOSE 97 08/31/2019   BUN 8 08/31/2019   CREATININE 0.83 08/31/2019   BILITOT 0.5 08/31/2019   ALKPHOS 78 08/31/2019   AST 20 08/31/2019   ALT 28 08/31/2019   PROT 8.9 (H) 08/31/2019   ALBUMIN 4.4 08/31/2019   CALCIUM 9.8 08/31/2019   GFRAA 96 08/31/2019  May 20, 2016 ANA positive no titer given, Ro positive, RF positive, ESR 48  Speciality Comments: No specialty comments available.  Procedures:  No procedures performed Allergies: Contrast media [iodinated diagnostic agents] and Sulfa antibiotics   Assessment / Plan:     Visit Diagnoses: Sjogren's syndrome with keratoconjunctivitis sicca ([place]) -she gives history of dry eyes and dry mouth.  She had labs in the past which were positive for ANA, positive Ro and RF+.  She was initially diagnosed with RA by Dr. Meda Coffee and was given a prescription for prednisone and methotrexate.  The methotrexate was discontinued due to increased fatigue and side effects.  She was later evaluated by  Dr. Rosanna Randy at [facility] who diagnosed her with Sjogren's and kept her on prednisone 10 mg p.o. daily for 1 year.  When she discontinued prednisone the rash came back and she was placed on prednisone 5 mg p.o. daily.  Rash-she has few scattered lesions on her left side of chest on a picture she showed me on her cell phone.  She also developed a rash on her bilateral lower extremities while she was at the beach it is hard to say if it was due to the sun exposure or some other reason.  Vasculitis also is my concern.  DDD (degenerative disc disease), cervical-she has disc disease of cervical spine which causes some discomfort.  Myalgia-she initially presented with myalgia.  She has not  had any muscle pain since 2018.  High risk med-we will taper her off prednisone by starting her on prednisone 4 mg p.o. daily and taper by 1 mg every month.  We will also obtain additional labs in case we  have to use any DMARD agents in the future.  Polycythemia, secondary-her hemoglobin is high.  I will check HFE gene at the next visit.  Heart palpitations-with anxiety.  Chronic fatigue  Adjustment reaction with anxiety and depression  Family history of cardiovascular disease  Family history of breast cancer  Family history of rheumatoid arthritis - First cousin  Family history of systemic lupus erythematosus - First cousin  Family history of pemphigus - Mother  Orders: Orders Placed This Encounter  Procedures  . CBC with Differential/Platelet  . Urinalysis, Routine w reflex microscopic  . Sedimentation rate  . CK  . Rheumatoid factor  . ANA  . Anti-Smith antibody  . Sjogrens syndrome-A extractable nuclear antibody  . Sjogrens syndrome-B extractable nuclear antibody  . Anti-DNA antibody, double-stranded  . Anti-scleroderma antibody  . C3 and C4  . Cardiolipin antibodies, IgG, IgM, IgA  . Lupus anticoagulant panel  . Pan-ANCA  . Hepatitis B core antibody, IgM  . Hepatitis B surface antigen  . CMP14+EGFR  . CYCLIC CITRUL PEPTIDE ANTIBODY, IGG/IGA  . Glucose 6 phosphate dehydrogenase  . Thiopurine methyltransferase(tpmt)rbc  . Protein Electrophoresis, (serum)  . IgG, IgA, IgM  . QuantiFERON-TB Gold Plus  . Beta-2-glycoprotein i abs, IgG/M/A  . RNP Antibodies   Meds ordered this encounter  Medications  . predniSONE (DELTASONE) 1 MG tablet    Sig: Take 4 tablets (4 mg total) by mouth daily with breakfast. Taper by 1 mg each month.    Dispense:  120 tablet    Refill:  0    Follow-Up Instructions: Return for Sjogren's, rash.   Bo Merino, MD  Note - This record has been created using Editor, commissioning.  Chart creation errors have been sought, but may  not always  have been located. Such creation errors do not reflect on  the standard of medical care.

## 2019-10-12 ENCOUNTER — Encounter: Payer: Self-pay | Admitting: Rheumatology

## 2019-10-12 ENCOUNTER — Ambulatory Visit (INDEPENDENT_AMBULATORY_CARE_PROVIDER_SITE_OTHER): Payer: Managed Care, Other (non HMO) | Admitting: Rheumatology

## 2019-10-12 ENCOUNTER — Other Ambulatory Visit: Payer: Self-pay

## 2019-10-12 VITALS — BP 134/88 | HR 77 | Resp 14 | Ht 65.0 in | Wt 163.8 lb

## 2019-10-12 DIAGNOSIS — Z79899 Other long term (current) drug therapy: Secondary | ICD-10-CM

## 2019-10-12 DIAGNOSIS — Z8269 Family history of other diseases of the musculoskeletal system and connective tissue: Secondary | ICD-10-CM

## 2019-10-12 DIAGNOSIS — R21 Rash and other nonspecific skin eruption: Secondary | ICD-10-CM

## 2019-10-12 DIAGNOSIS — R768 Other specified abnormal immunological findings in serum: Secondary | ICD-10-CM

## 2019-10-12 DIAGNOSIS — M5030 Other cervical disc degeneration, unspecified cervical region: Secondary | ICD-10-CM | POA: Diagnosis not present

## 2019-10-12 DIAGNOSIS — Z840 Family history of diseases of the skin and subcutaneous tissue: Secondary | ICD-10-CM

## 2019-10-12 DIAGNOSIS — R5382 Chronic fatigue, unspecified: Secondary | ICD-10-CM

## 2019-10-12 DIAGNOSIS — Z8261 Family history of arthritis: Secondary | ICD-10-CM

## 2019-10-12 DIAGNOSIS — Z803 Family history of malignant neoplasm of breast: Secondary | ICD-10-CM

## 2019-10-12 DIAGNOSIS — Z111 Encounter for screening for respiratory tuberculosis: Secondary | ICD-10-CM

## 2019-10-12 DIAGNOSIS — M791 Myalgia, unspecified site: Secondary | ICD-10-CM | POA: Diagnosis not present

## 2019-10-12 DIAGNOSIS — F4323 Adjustment disorder with mixed anxiety and depressed mood: Secondary | ICD-10-CM

## 2019-10-12 DIAGNOSIS — M3501 Sicca syndrome with keratoconjunctivitis: Secondary | ICD-10-CM | POA: Diagnosis not present

## 2019-10-12 DIAGNOSIS — R002 Palpitations: Secondary | ICD-10-CM

## 2019-10-12 DIAGNOSIS — Z8249 Family history of ischemic heart disease and other diseases of the circulatory system: Secondary | ICD-10-CM

## 2019-10-12 DIAGNOSIS — D751 Secondary polycythemia: Secondary | ICD-10-CM

## 2019-10-12 MED ORDER — PREDNISONE 1 MG PO TABS: 4.0000 mg | ORAL_TABLET | Freq: Every day | ORAL | 0 refills | Status: DC

## 2019-10-13 NOTE — Progress Notes (Signed)
I will discuss results at the new patient follow-up

## 2019-10-17 LAB — CMP14+EGFR
ALT: 25 IU/L (ref 0–32)
AST: 16 IU/L (ref 0–40)
Albumin/Globulin Ratio: 1.2 (ref 1.2–2.2)
Albumin: 4.8 g/dL (ref 3.8–4.8)
Alkaline Phosphatase: 105 IU/L (ref 44–121)
BUN/Creatinine Ratio: 7 — ABNORMAL LOW (ref 9–23)
BUN: 6 mg/dL (ref 6–24)
Bilirubin Total: 0.6 mg/dL (ref 0.0–1.2)
CO2: 25 mmol/L (ref 20–29)
Calcium: 9.9 mg/dL (ref 8.7–10.2)
Chloride: 102 mmol/L (ref 96–106)
Creatinine, Ser: 0.82 mg/dL (ref 0.57–1.00)
GFR calc Af Amer: 97 mL/min/{1.73_m2} (ref >59)
GFR calc non Af Amer: 84 mL/min/{1.73_m2} (ref >59)
Globulin, Total: 4 g/dL (ref 1.5–4.5)
Glucose: 94 mg/dL (ref 65–99)
Potassium: 4.2 mmol/L (ref 3.5–5.2)
Sodium: 141 mmol/L (ref 134–144)
Total Protein: 8.8 g/dL — ABNORMAL HIGH (ref 6.0–8.5)

## 2019-10-17 LAB — PAN-ANCA
ANCA Proteinase 3: <3.5 U/mL (ref 0.0–3.5)
Atypical pANCA: <1:20 {titer}
C-ANCA: <1:20 {titer}
Myeloperoxidase Ab: <9 U/mL (ref 0.0–9.0)
P-ANCA: <1:20 {titer}

## 2019-10-17 LAB — HEPATITIS B SURFACE ANTIGEN: Hepatitis B Surface Ag: NEGATIVE

## 2019-10-17 LAB — URINALYSIS, ROUTINE W REFLEX MICROSCOPIC
Bilirubin, UA: NEGATIVE
Glucose, UA: NEGATIVE
Nitrite, UA: NEGATIVE
Protein,UA: NEGATIVE
RBC, UA: NEGATIVE
Specific Gravity, UA: 1.019 (ref 1.005–1.030)
Urobilinogen, Ur: 1 mg/dL (ref 0.2–1.0)
pH, UA: 7 (ref 5.0–7.5)

## 2019-10-17 LAB — PROTEIN ELECTROPHORESIS, SERUM
A/G Ratio: 0.9 (ref 0.7–1.7)
Albumin ELP: 4.2 g/dL (ref 2.9–4.4)
Alpha 1: 0.3 g/dL (ref 0.0–0.4)
Alpha 2: 0.7 g/dL (ref 0.4–1.0)
Beta: 1.1 g/dL (ref 0.7–1.3)
Gamma Globulin: 2.5 g/dL — ABNORMAL HIGH (ref 0.4–1.8)
Globulin, Total: 4.6 g/dL — ABNORMAL HIGH (ref 2.2–3.9)

## 2019-10-17 LAB — SJOGRENS SYNDROME-A EXTRACTABLE NUCLEAR ANTIBODY: ENA SSA (RO) Ab: >8 AI — ABNORMAL HIGH (ref 0.0–0.9)

## 2019-10-17 LAB — ANA: Anti Nuclear Antibody (ANA): POSITIVE — AB

## 2019-10-17 LAB — LUPUS ANTICOAGULANT PANEL
Dilute Viper Venom Time: 31.4 s (ref 0.0–47.0)
PTT Lupus Anticoagulant: 27.1 s (ref 0.0–51.9)

## 2019-10-17 LAB — CBC WITH DIFFERENTIAL/PLATELET
Basophils Absolute: 0 10*3/uL (ref 0.0–0.2)
Basos: 1 %
EOS (ABSOLUTE): 0.1 10*3/uL (ref 0.0–0.4)
Eos: 1 %
Hematocrit: 45.1 % (ref 34.0–46.6)
Hemoglobin: 16.1 g/dL — ABNORMAL HIGH (ref 11.1–15.9)
Immature Grans (Abs): 0 10*3/uL (ref 0.0–0.1)
Immature Granulocytes: 1 %
Lymphocytes Absolute: 1.2 10*3/uL (ref 0.7–3.1)
Lymphs: 23 %
MCH: 30.3 pg (ref 26.6–33.0)
MCHC: 35.7 g/dL (ref 31.5–35.7)
MCV: 85 fL (ref 79–97)
Monocytes Absolute: 0.2 10*3/uL (ref 0.1–0.9)
Monocytes: 5 %
Neutrophils Absolute: 3.8 10*3/uL (ref 1.4–7.0)
Neutrophils: 69 %
Platelets: 235 10*3/uL (ref 150–450)
RBC: 5.32 x10E6/uL — ABNORMAL HIGH (ref 3.77–5.28)
RDW: 13.1 % (ref 11.7–15.4)
WBC: 5.4 10*3/uL (ref 3.4–10.8)

## 2019-10-17 LAB — THIOPURINE METHYLTRANSFERASE (TPMT), RBC: TPMT Activity:: 28.3 Units/mL RBC

## 2019-10-17 LAB — MICROSCOPIC EXAMINATION
Bacteria, UA: NONE SEEN
Casts: NONE SEEN /lpf
WBC, UA: NONE SEEN /hpf (ref 0–5)

## 2019-10-17 LAB — BETA-2-GLYCOPROTEIN I ABS, IGG/M/A
Beta-2 Glyco 1 IgA: <9 GPI IgA units (ref 0–25)
Beta-2 Glyco 1 IgM: <9 GPI IgM units (ref 0–32)
Beta-2 Glyco I IgG: <9 GPI IgG units (ref 0–20)

## 2019-10-17 LAB — QUANTIFERON-TB GOLD PLUS
QuantiFERON Mitogen Value: >10 IU/mL
QuantiFERON Nil Value: 0.04 IU/mL
QuantiFERON TB1 Ag Value: 0.06 IU/mL
QuantiFERON TB2 Ag Value: 0.06 IU/mL
QuantiFERON-TB Gold Plus: NEGATIVE

## 2019-10-17 LAB — IGG, IGA, IGM
IgA/Immunoglobulin A, Serum: 406 mg/dL — ABNORMAL HIGH (ref 87–352)
IgG (Immunoglobin G), Serum: 2519 mg/dL — ABNORMAL HIGH (ref 586–1602)
IgM (Immunoglobulin M), Srm: 130 mg/dL (ref 26–217)

## 2019-10-17 LAB — CK: Total CK: 21 U/L — ABNORMAL LOW (ref 32–182)

## 2019-10-17 LAB — RHEUMATOID FACTOR: Rheumatoid fact SerPl-aCnc: 60.3 IU/mL — ABNORMAL HIGH (ref 0.0–13.9)

## 2019-10-17 LAB — GLUCOSE 6 PHOSPHATE DEHYDROGENASE: G-6-PD, Quant: 9 U/g{Hb} (ref 4.7–14.6)

## 2019-10-17 LAB — SEDIMENTATION RATE: Sed Rate: 19 mm/hr (ref 0–32)

## 2019-10-17 LAB — C3 AND C4
Complement C3, Serum: 132 mg/dL (ref 82–167)
Complement C4, Serum: 26 mg/dL (ref 12–38)

## 2019-10-17 LAB — CARDIOLIPIN ANTIBODIES, IGG, IGM, IGA
Anticardiolipin IgA: <9 APL U/mL (ref 0–11)
Anticardiolipin IgG: <9 GPL U/mL (ref 0–14)
Anticardiolipin IgM: <9 MPL U/mL (ref 0–12)

## 2019-10-17 LAB — ANTI-SMITH ANTIBODY: ENA SM Ab Ser-aCnc: <0.2 AI (ref 0.0–0.9)

## 2019-10-17 LAB — ANTI-DNA ANTIBODY, DOUBLE-STRANDED: dsDNA Ab: 2 IU/mL (ref 0–9)

## 2019-10-17 LAB — HEPATITIS B CORE ANTIBODY, IGM: Hep B C IgM: NEGATIVE

## 2019-10-17 LAB — CYCLIC CITRUL PEPTIDE ANTIBODY, IGG/IGA: Cyclic Citrullin Peptide Ab: 8 units (ref 0–19)

## 2019-10-17 LAB — RNP ANTIBODIES: ENA RNP Ab: <0.2 AI (ref 0.0–0.9)

## 2019-10-17 LAB — ANTI-SCLERODERMA ANTIBODY: Scleroderma (Scl-70) (ENA) Antibody, IgG: <0.2 AI (ref 0.0–0.9)

## 2019-10-17 LAB — SJOGRENS SYNDROME-B EXTRACTABLE NUCLEAR ANTIBODY: ENA SSB (LA) Ab: <0.2 AI (ref 0.0–0.9)

## 2019-10-28 NOTE — Progress Notes (Deleted)
Office Visit Note  Patient: Meghan [name]             Date of Birth: [birth year]           MRN: [id number]             PCP: Mar Daring, PA-C Referring: Florian Buff* Visit Date: 11/07/2019 Occupation: '@GUAROCC' @  Subjective:  No chief complaint on file.   History of Present Illness: Meghan [name] is a 49 y.o. female ***   Activities of Daily Living:  Patient reports morning stiffness for *** {minute/hour:19697}.   Patient {ACTIONS;DENIES/REPORTS:21021675::"Denies"} nocturnal pain.  Difficulty dressing/grooming: {ACTIONS;DENIES/REPORTS:21021675::"Denies"} Difficulty climbing stairs: {ACTIONS;DENIES/REPORTS:21021675::"Denies"} Difficulty getting out of chair: {ACTIONS;DENIES/REPORTS:21021675::"Denies"} Difficulty using hands for taps, buttons, cutlery, and/or writing: {ACTIONS;DENIES/REPORTS:21021675::"Denies"}  No Rheumatology ROS completed.   PMFS History:  Patient Active Problem List   Diagnosis Date Noted   Rash 08/03/2019   Anxiety 04/21/2017   DDD (degenerative disc disease), cervical 03/30/2017   Seropositive rheumatoid arthritis ([place]) 06/01/2016   Positive ANA (antinuclear antibody) 06/01/2016   Heart palpitations 07/10/2015   Allergic rhinitis 03/04/2015   Attention deficit disorder 03/04/2015   Chronic bronchitis ([place]) 03/04/2015   Acid reflux 03/04/2015   Panic attack 03/04/2015   Tobacco consumption 03/04/2015   Ovarian cyst, left 04/18/2014   Fatigue 07/29/2009   Clinical depression 12/26/2007   Family history of cardiovascular disease 12/26/2007   Family history of breast cancer 12/26/2007   Polycythemia, secondary 12/26/2007    Past Medical History:  Diagnosis Date   Anxiety    Depression    Fatigue    GERD (gastroesophageal reflux disease)    PMB (postmenopausal bleeding)    RA (rheumatoid arthritis) ([place])    [facility]ot[facility] seropostivie RA, positive  ANA    Family History  Problem Relation Age of Onset   Heart attack Father 12       HAD cabg, HAD DEFIBRILLATOR    Diabetes Father    Depression Father    Heart disease Father    Bladder Cancer Father        deceased at 85   Hyperlipidemia Mother    Breast cancer Mother 36       TAH/BSO in late 63s; currently 49yo   Autoimmune disease Mother    Anxiety disorder Sister    Schizophrenia Brother    Anxiety disorder Brother    Anxiety disorder Sister    Breast cancer Maternal Aunt        deceased in 25s/60s   Breast cancer Cousin        2 first cousins, maternal   Lymphoma Maternal Uncle        deceased   Past Surgical History:  Procedure Laterality Date   CHOLECYSTECTOMY OPEN  1987   W/  APPENDECTOMY   HYSTEROSCOPY W/ ENDOMETRIAL ABLATION  2004   W/  LAPAROSCOPIC BILATERAL TUBAL LIGATION   HYSTEROSCOPY WITH D & C N/A 06/22/2018   Procedure: DILATATION AND CURETTAGE /HYSTEROSCOPY;  Surgeon: Jerelyn Charles, MD;  Location: [facility];  Service: Gynecology;  Laterality: N/A;   LAPAROSCOPIC UNILATERAL SALPINGECTOMY Left 2002   ECTOPIC   Social History   Social History Narrative   Married, full time, gets regular exercise.    Immunization History  Administered Date(s) Administered   Influenza,inj,Quad PF,6+ Mos 10/29/2017   PFIZER SARS-COV-2 Vaccination 03/30/2019, 04/20/2019     Objective: Vital Signs: LMP 08/13/2014 Comment: abalation   Physical Exam   Musculoskeletal  Exam: ***  CDAI Exam: CDAI Score: -- Patient Global: --; Provider Global: -- Swollen: --; Tender: -- Joint Exam 11/07/2019   No joint exam has been documented for this visit   There is currently no information documented on the homunculus. Go to the Rheumatology activity and complete the homunculus joint exam.  Investigation: No additional findings.  Imaging: No results found.  Recent Labs: Lab Results  Component Value Date   WBC 5.4 10/12/2019   HGB  16.1 (H) 10/12/2019   PLT 235 10/12/2019   NA 141 10/12/2019   K 4.2 10/12/2019   CL 102 10/12/2019   CO2 25 10/12/2019   GLUCOSE 94 10/12/2019   BUN 6 10/12/2019   CREATININE 0.82 10/12/2019   BILITOT 0.6 10/12/2019   ALKPHOS 105 10/12/2019   AST 16 10/12/2019   ALT 25 10/12/2019   PROT 8.8 (H) 10/12/2019   ALBUMIN 4.8 10/12/2019   CALCIUM 9.9 10/12/2019   GFRAA 97 10/12/2019   QFTBGOLDPLUS Negative 10/12/2019  October 12, 2019 UA negative, ANA positive, titer not available, Ro positive (Smith, SSB, dsDNA, SCL 70 -), C3-C4 normal, lupus anticoagulant negative, anticardiolipin negative, p-ANCA negative, c-ANCA negative, MPO negative, proteinase 3 -, hepatitis B-, RF 60.3, ESR 19, CK 21,  Speciality Comments: No specialty comments available.  Procedures:  No procedures performed Allergies: Contrast media [iodinated diagnostic agents] and Sulfa antibiotics   Assessment / Plan:     Visit Diagnoses: No diagnosis found.  Orders: No orders of the defined types were placed in this encounter.  No orders of the defined types were placed in this encounter.   Face-to-face time spent with patient was *** minutes. Greater than 50% of time was spent in counseling and coordination of care.  Follow-Up Instructions: No follow-ups on file.   Bo Merino, MD  Note - This record has been created using Editor, commissioning.  Chart creation errors have been sought, but may not always  have been located. Such creation errors do not reflect on  the standard of medical care.

## 2019-10-30 ENCOUNTER — Other Ambulatory Visit: Payer: Self-pay | Admitting: *Deleted

## 2019-10-30 ENCOUNTER — Telehealth: Payer: Self-pay | Admitting: *Deleted

## 2019-10-30 NOTE — Telephone Encounter (Signed)
Attempted to contact the patient and left message for patient to call the office. All labs have resulted. Patient will need the ANA titer only.

## 2019-10-30 NOTE — Telephone Encounter (Signed)
-----   Message from Bo Merino, MD sent at 10/28/2019 10:21 PM EDT ----- We ordered several labs at the last visit.  Patient works at [employer] and got labs done there.  ANA titer was not done again.  Several labs were not done which were ordered.  Please advise patient to get all the labs which were not done.  I plan to discuss all the lab results at the follow-up visit. Thank you, Bo Merino, MD

## 2019-11-07 ENCOUNTER — Ambulatory Visit: Payer: Managed Care, Other (non HMO) | Admitting: Rheumatology

## 2019-11-07 DIAGNOSIS — R21 Rash and other nonspecific skin eruption: Secondary | ICD-10-CM

## 2019-11-07 DIAGNOSIS — D751 Secondary polycythemia: Secondary | ICD-10-CM

## 2019-11-07 DIAGNOSIS — M791 Myalgia, unspecified site: Secondary | ICD-10-CM

## 2019-11-07 DIAGNOSIS — F4323 Adjustment disorder with mixed anxiety and depressed mood: Secondary | ICD-10-CM

## 2019-11-07 DIAGNOSIS — Z803 Family history of malignant neoplasm of breast: Secondary | ICD-10-CM

## 2019-11-07 DIAGNOSIS — Z7952 Long term (current) use of systemic steroids: Secondary | ICD-10-CM

## 2019-11-07 DIAGNOSIS — Z79899 Other long term (current) drug therapy: Secondary | ICD-10-CM

## 2019-11-07 DIAGNOSIS — R002 Palpitations: Secondary | ICD-10-CM

## 2019-11-07 DIAGNOSIS — Z8249 Family history of ischemic heart disease and other diseases of the circulatory system: Secondary | ICD-10-CM

## 2019-11-07 DIAGNOSIS — M5030 Other cervical disc degeneration, unspecified cervical region: Secondary | ICD-10-CM

## 2019-11-07 DIAGNOSIS — M3501 Sicca syndrome with keratoconjunctivitis: Secondary | ICD-10-CM

## 2019-11-07 DIAGNOSIS — Z8269 Family history of other diseases of the musculoskeletal system and connective tissue: Secondary | ICD-10-CM

## 2019-11-07 DIAGNOSIS — Z8261 Family history of arthritis: Secondary | ICD-10-CM

## 2019-11-07 DIAGNOSIS — Z840 Family history of diseases of the skin and subcutaneous tissue: Secondary | ICD-10-CM

## 2019-11-09 ENCOUNTER — Ambulatory Visit: Payer: Managed Care, Other (non HMO) | Admitting: Dermatology

## 2019-11-30 ENCOUNTER — Encounter: Payer: Self-pay | Admitting: Physician Assistant

## 2019-11-30 DIAGNOSIS — F900 Attention-deficit hyperactivity disorder, predominantly inattentive type: Secondary | ICD-10-CM

## 2019-12-01 MED ORDER — AMPHETAMINE-DEXTROAMPHET ER 15 MG PO CP24: 15.0000 mg | ORAL_CAPSULE | ORAL | 0 refills | Status: DC

## 2020-01-02 ENCOUNTER — Encounter: Payer: Self-pay | Admitting: Physician Assistant

## 2020-01-02 DIAGNOSIS — F411 Generalized anxiety disorder: Secondary | ICD-10-CM

## 2020-01-02 DIAGNOSIS — F900 Attention-deficit hyperactivity disorder, predominantly inattentive type: Secondary | ICD-10-CM

## 2020-01-03 MED ORDER — CLONAZEPAM 0.5 MG PO TABS: 0.5000 mg | ORAL_TABLET | Freq: Two times a day (BID) | ORAL | 0 refills | Status: DC | PRN

## 2020-01-03 MED ORDER — AMPHETAMINE-DEXTROAMPHET ER 15 MG PO CP24
15.0000 mg | ORAL_CAPSULE | ORAL | 0 refills | Status: DC
Start: 2020-01-03 — End: 2020-02-01

## 2020-01-25 ENCOUNTER — Encounter: Payer: Self-pay | Admitting: Physician Assistant

## 2020-01-25 DIAGNOSIS — M059 Rheumatoid arthritis with rheumatoid factor, unspecified: Secondary | ICD-10-CM

## 2020-01-25 DIAGNOSIS — F411 Generalized anxiety disorder: Secondary | ICD-10-CM

## 2020-01-25 DIAGNOSIS — F900 Attention-deficit hyperactivity disorder, predominantly inattentive type: Secondary | ICD-10-CM

## 2020-01-25 MED ORDER — PREDNISONE 5 MG PO TABS
5.0000 mg | ORAL_TABLET | Freq: Every day | ORAL | 2 refills | Status: AC
Start: 2020-01-25 — End: ?

## 2020-01-25 MED ORDER — CLONAZEPAM 0.5 MG PO TABS: 0.5000 mg | ORAL_TABLET | Freq: Two times a day (BID) | ORAL | 2 refills | Status: DC | PRN

## 2020-02-01 MED ORDER — AMPHETAMINE-DEXTROAMPHET ER 15 MG PO CP24: 15.0000 mg | ORAL_CAPSULE | ORAL | 0 refills | Status: DC

## 2020-02-01 NOTE — Addendum Note (Signed)
Addended by: Mar Daring on: 02/01/2020 11:33 AM   Modules accepted: Orders

## 2020-03-11 ENCOUNTER — Encounter: Payer: Self-pay | Admitting: Physician Assistant

## 2020-03-11 DIAGNOSIS — F900 Attention-deficit hyperactivity disorder, predominantly inattentive type: Secondary | ICD-10-CM

## 2020-03-11 MED ORDER — AMPHETAMINE-DEXTROAMPHET ER 15 MG PO CP24: 15.0000 mg | ORAL_CAPSULE | ORAL | 0 refills | Status: DC

## 2020-03-26 ENCOUNTER — Encounter: Payer: Self-pay | Admitting: Physician Assistant

## 2020-03-26 DIAGNOSIS — F411 Generalized anxiety disorder: Secondary | ICD-10-CM

## 2020-03-27 MED ORDER — CLONAZEPAM 0.5 MG PO TABS: 0.5000 mg | ORAL_TABLET | Freq: Two times a day (BID) | ORAL | 0 refills | Status: AC | PRN

## 2020-04-09 ENCOUNTER — Encounter: Payer: Self-pay | Admitting: Physician Assistant

## 2020-04-09 DIAGNOSIS — F900 Attention-deficit hyperactivity disorder, predominantly inattentive type: Secondary | ICD-10-CM

## 2020-04-10 MED ORDER — AMPHETAMINE-DEXTROAMPHET ER 15 MG PO CP24: 15.0000 mg | ORAL_CAPSULE | ORAL | 0 refills | Status: AC

## 2021-08-20 IMAGING — CR DG LUMBAR SPINE COMPLETE 4+V
1 series · 5 of 5 positions shown · non-contrast
Comparison: None.

CLINICAL DATA: Low back pain

EXAM:
LUMBAR SPINE - COMPLETE 4+ VIEW

[Series 1: dg lumbar spine complete 4 +v · 0.14mm/px · 5 of 5 slices shown]
[im 1/5]
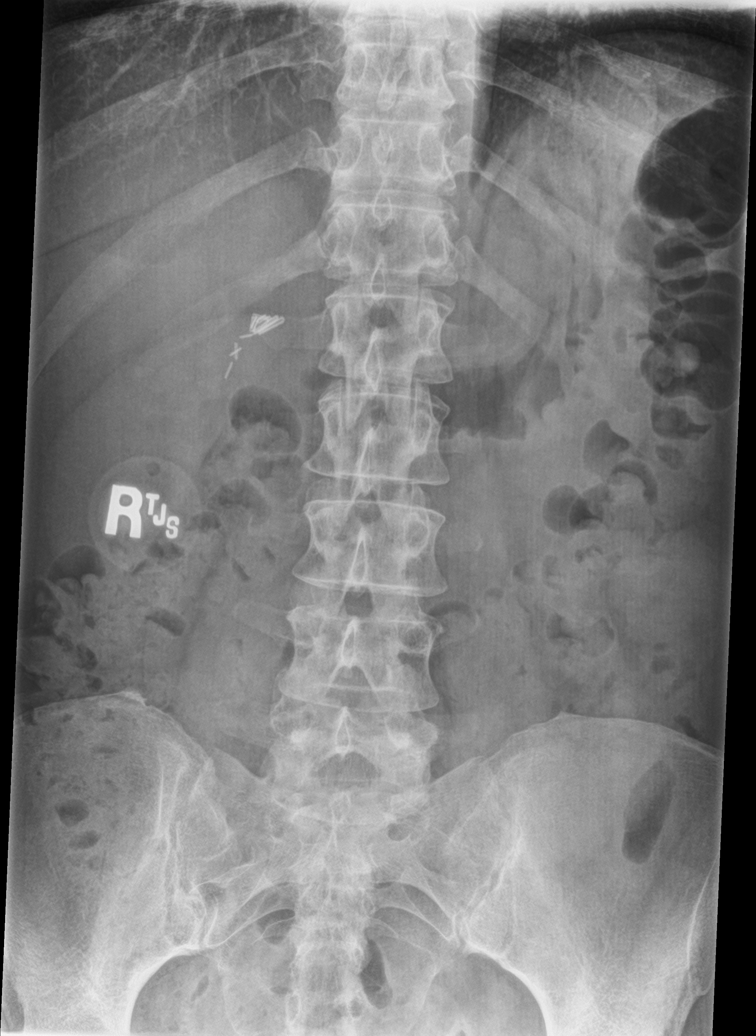
[im 2/5]
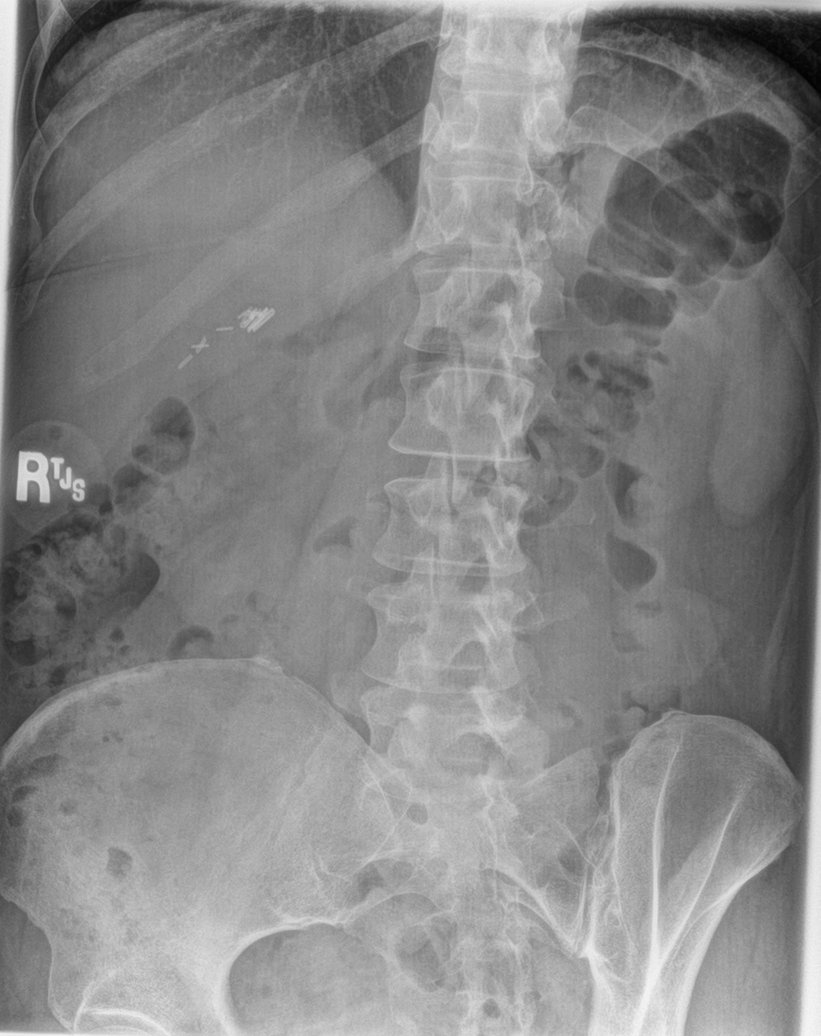
[im 3/5]
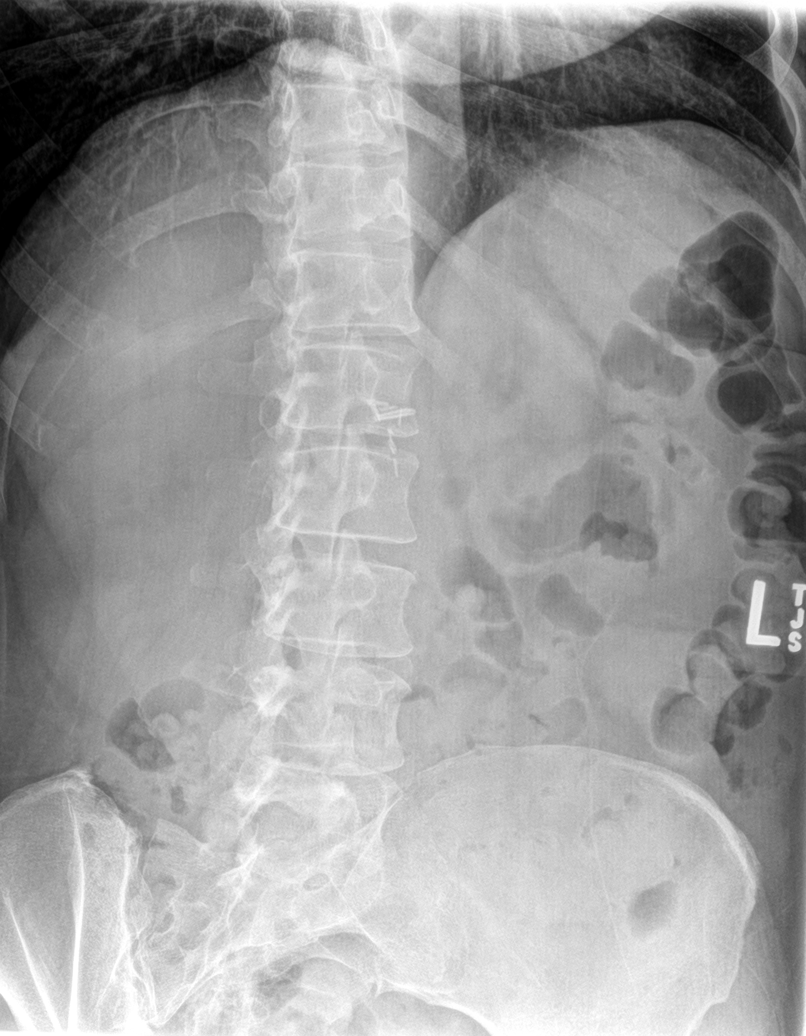
[im 4/5]
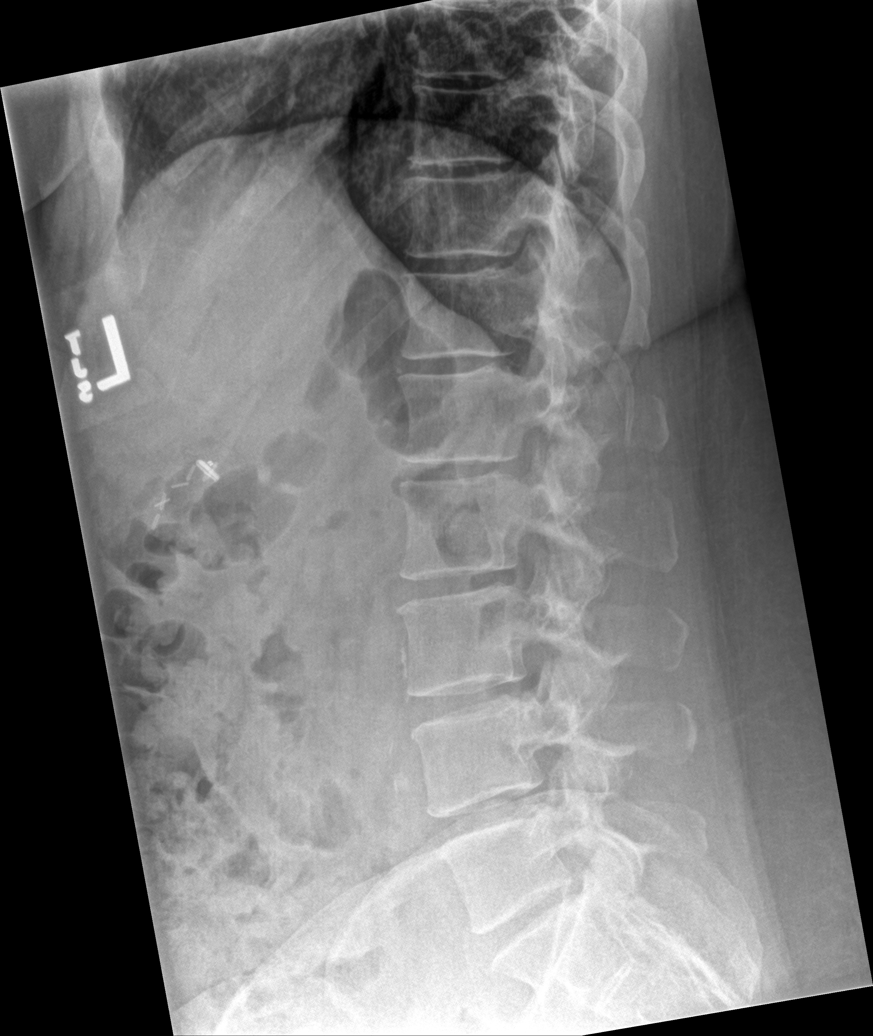
[im 5/5]
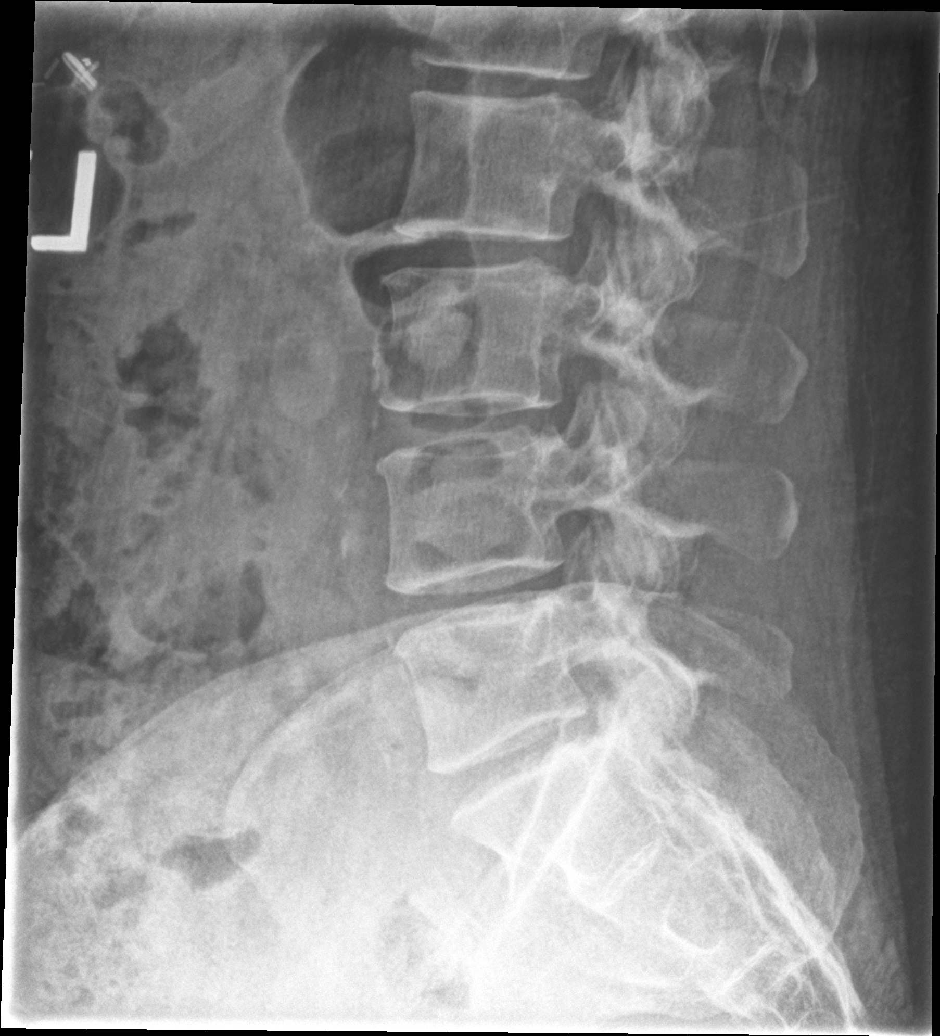

[5 of 5 positions shown; findings below may reference images not displayed]

FINDINGS: Surgical clips in the right upper quadrant. Lumbar alignment within
normal limits. Vertebral body heights are maintained. Mild disc
space narrowing at L2-L3 and L5-S1. Minimal aortic atherosclerosis.
IMPRESSION: Minimal degenerative change.  No acute osseous abnormality

## 2022-02-24 ENCOUNTER — Encounter: Payer: Self-pay | Admitting: Gastroenterology

## 2022-03-23 ENCOUNTER — Telehealth: Payer: Self-pay | Admitting: Rheumatology

## 2022-03-23 NOTE — Telephone Encounter (Signed)
Copied from [facility] [id number]. Topic: Medications/Prescriptions - Medication Question/Problem  >> Mar 23, 2022  9:40 AM Laroy Apple wrote:  The patient is requesting to speak with a Nurse. Patient stated that they are currently taking 40 mg's of prednisone as of 03/17/2022 and would like to know if they should reschedule their NPV that is scheduled on 03/25/2022 or still come.    Please call the patient back at [phone] to discuss.

## 2022-03-23 NOTE — Telephone Encounter (Signed)
Pt called with concerns starting '40mg'$  prednisone for herniated disc.     Pt worried medication will ask symptoms.     Writer advised pt to keep appointment as it is a NPV

## 2022-03-25 ENCOUNTER — Other Ambulatory Visit: Payer: Self-pay

## 2022-03-25 ENCOUNTER — Other Ambulatory Visit
Admission: RE | Admit: 2022-03-25 | Discharge: 2022-03-25 | Disposition: A | Discharge status: Home / Self Care | Payer: BLUE CROSS/BLUE SHIELD | Source: Ambulatory Visit | Attending: Rheumatology | Admitting: Rheumatology

## 2022-03-25 ENCOUNTER — Encounter: Payer: Self-pay | Admitting: Rheumatology

## 2022-03-25 ENCOUNTER — Ambulatory Visit: Payer: BLUE CROSS/BLUE SHIELD | Admitting: Rheumatology

## 2022-03-25 VITALS — BP 140/80 | HR 91 | Temp 97.9°F | Ht 66.0 in | Wt 182.0 lb

## 2022-03-25 DIAGNOSIS — M3500 Sicca syndrome, unspecified: Secondary | ICD-10-CM | POA: Insufficient documentation

## 2022-03-25 LAB — CBC AND DIFFERENTIAL
Baso # K/uL: 0 10*3/uL (ref 0.0–0.2)
Basophil %: 0.5 %
Eos # K/uL: 0 10*3/uL (ref 0.0–0.5)
Eosinophil %: 0.4 %
Hematocrit: 45 % (ref 34–49)
Hemoglobin: 14.7 g/dL (ref 11.2–16.0)
IMM Granulocytes #: 0 10*3/uL (ref 0.0–0.0)
IMM Granulocytes: 0.5 %
Lymph # K/uL: 1.5 10*3/uL (ref 1.0–5.0)
Lymphocyte %: 19 %
MCH: 28 pg (ref 26–32)
MCHC: 33 g/dL (ref 32–36)
MCV: 86 fL (ref 75–100)
Mono # K/uL: 0.4 10*3/uL (ref 0.1–1.0)
Monocyte %: 5.1 %
Neut # K/uL: 5.8 10*3/uL (ref 1.5–6.5)
Nucl RBC # K/uL: 0 10*3/uL (ref 0.0–0.0)
Nucl RBC %: 0 /100 WBC (ref 0.0–0.2)
Platelets: 301 10*3/uL (ref 150–450)
RBC: 5.2 MIL/uL (ref 4.0–5.5)
RDW: 12.7 % (ref 0.0–15.0)
Seg Neut %: 74.5 %
WBC: 7.8 10*3/uL (ref 3.5–11.0)

## 2022-03-25 LAB — C3 COMPLEMENT: C3: 115 mg/dL (ref 90–180)

## 2022-03-25 LAB — CRP: CRP: <3 mg/L (ref 0–8)

## 2022-03-25 LAB — C4 COMPLEMENT: C4: 19 mg/dL (ref 10–40)

## 2022-03-25 LAB — PROTEIN ELECT WITH REFLEX TO IMMUNOFIXATION: Total Protein: 8 g/dL — ABNORMAL HIGH (ref 6.3–7.7)

## 2022-03-25 LAB — SEDIMENTATION RATE, AUTOMATED: Sedimentation Rate: 42 mm/hr — ABNORMAL HIGH (ref 0–30)

## 2022-03-25 MED ORDER — HYDROXYCHLOROQUINE SULFATE 200 MG PO TABS *I*
200.0000 mg | ORAL_TABLET | Freq: Every day | ORAL | 5 refills | Status: DC
Start: 2022-03-25 — End: 2022-11-13

## 2022-03-25 NOTE — Patient Instructions (Signed)
Hydroxychloroquine    Description  Hydroxychloroquine (Plaquenil) is considered a disease-modifying anti-rheumatic drug (DMARD) because it can decrease the pain and swelling of arthritis, and it may prevent joint damage and reduce the risk of long-term disability.    Fast Facts  Hydroxychloroquine often is used for mild rheumatoid arthritis or in combination with other drugs for more severe disease.   Hydroxychloroquine is commonly used to manage multiple complications of lupus and connective tissue disorders.   Hydroxychloroquine is a relatively safe medication, though monitoring by an ophthalmologist is recommended while taking this drug.     Uses   Hydroxychloroquine is in a class of medications that was first used to prevent and treat malaria. Today it is used to treat rheumatoid arthritis, some symptoms of lupus, juvenile rheumatoid arthritis, and other autoimmune diseases, though these diseases are not caused by malaria parasites.    How it works   It is not clear why hydroxychloroquine is effective at treating autoimmune diseases. It is believed that hydroxychloroquine interferes with communication of cells in the immune system.     Dosing   Hydroxychloroquine generally is given to adults in doses of 200 mg or 400 mg per day. In some cases, higher doses can be used.    Time to effect   Symptoms can start to improve in 1-2 months, but it may take up to 6 months before full benefits of this medication are experienced.     Side effects   Hydroxychloroquine typically is very well tolerated, and serious side effects are rare. The most common side effects are nausea and diarrhea, which often improve with time or by taking the medication with food. Less common side effects include skin rashes, changes in skin pigment or hair changes (bleaching or thinning of hair), and weakness. Rarely, hydroxychloroquine can lead to anemia in some individuals.  In rare cases, hydroxychloroquine has caused visual changes or loss of  vision, but such vision problems are more likely to occur in individuals taking high doses for many years, or in persons 60 years or older or with significant kidney disease. The dose used today is lower than the one originally used to treat arthritis or malaria. At the current recommended dose, development of visual problems while taking this medication is extremely unusual.     Points to remember   Although vision problems and loss of sight while taking hydroxychloroquine for the treatment of lupus or arthritis are very rare, notify your doctor if you notice any changes in your vision. Your doctor also may suggest regular eye exams while taking this medication. Visual changes experienced by the patient early on or seen early during regular eye exams usually improve after stopping the medication.   If you are pregnant or are considering having a child, discuss this with your doctor before taking this medication. Although hydroxychloroquine use might be safe during pregnancy, any medication taken during pregnancy should be discussed with a doctor.     Drug interactions   Although there are few drug interactions with hydroxychloroquine, to be safe be sure to tell your doctor about all of the medications you are taking, including over-the-counter drugs and natural remedies.     Information to Discuss with Your Primary Care Physician and other Specialists  Be sure to notify your other physicians that you are taking this drug. This drug does not have a strong effect on the immune system, so vaccines recommended by other physicians are generally acceptable. Be sure to notify your ophthalmologist that   you are taking this medicine so you can have the correct visual screening tests.    For more information   The American College of Rheumatology has compiled this list to give you a starting point for your own additional research. The ACR does not endorse or maintain these Web sites, and is not responsible for any information  or claims provided on them. It is always best to talk with your rheumatologist for more information and before making any decisions about your care.     National Institutes of Health Medline Plus link   http://www.nlm.nih.gov/medlineplus/druginfo/meds/a601240.html     ACR Position Statement on screening for hydroxychloroquine retinopathy   http://www.rheumatology.org/practice/clinical/position/hydroxy.pdf     Lupus Foundation of America on Anti-Malarials in Lupus  http://www.lupus.org/webmodules/webarticlesnet/templates/new_aboutaffects.aspx?articleid=87&zoneid=17     Updated June 2009  Written by Michael Cannon, MD, and reviewed by the American College of Rheumatology Patient Education Task Force.   This patient fact sheet is provided for general education only. Individuals should consult a qualified health care provider for professional medical advice, diagnoses and treatment of a medical or health condition.   © 2010 American College of Rheumatology

## 2022-03-26 NOTE — Letter (Signed)
March 26, 2022      RE:   Sheila Gonzalez   DOB:  23-Sep-1970  Unit#:  [id number]  CSN:  [id number]    Sheila Gonzalez is a 52 year old referred by Dr. Phillips Odor for further evaluation of her presumed history of Sjogren's.  She tells me in 2019, she developed some upper extremity discomfort and was seen in [place] by a rheumatologist who felt that she might have a condition warranting use of methotrexate.  She does note that she did not tolerate this and had not sought another opinion in the area at that point.  Someone suggested that she have Sjogren's, rheumatoid arthritis and was taken off methotrexate and started on prednisone, though she has largely been on 5 mg of prednisone for the past 6 years refilled through her primary care physician as she has been moving.  Her current complaints are some rare myalgias in the upper extremities and she has more recently noticed some mottling of the left leg, perhaps over the past few months.  She has really not had arthralgias to date.  She is troubled by oral and ocular dryness.  Prior labs have included a positive rheumatoid factor.  She is SSA positive and she has had a sed rate of 53 at some point, I believe through [facility] labs.  No oral ulcers.  No Raynaud's.  No unexplained symptoms suggestive of serositis.    Current Outpatient Medications:     predniSONE (DELTASONE) 5 mg tablet, , Disp: , Rfl:     naproxen (NAPROSYN) 250 mg tablet, Take 1 tablet (250 mg total) by mouth 2 times daily (with meals), Disp: , Rfl:     clonazePAM (KLONOPIN) 0.5 mg tablet, , Disp: , Rfl:     hydroxychloroquine (PLAQUENIL) 200 mg tablet, Take 1 tablet (200 mg total) by mouth daily (with breakfast)  for Skin Allergy to Sunlight, Disp: 30 tablet, Rfl: 5\  History reviewed. No pertinent past medical history.    PAST SURGICAL HISTORY:  She has had a salpingectomy following an ectopic pregnancy, appendectomy, cholecystectomy.  Gravida 3, para 2, altogether.  Social History      Socioeconomic History    Marital status: Unknown       FAMILY HISTORY:  Mother has pemphigus.  On her mother's side of the family, she has first cousins with RA and lupus, but no first-degree relatives with systemic autoimmune disease.  Her two children are generally healthy.    Vitals:    03/25/22 1517   BP: 140/80   Pulse: 91   Temp: 36.6 ?C (97.9 ?F)   Weight: 82.6 kg (182 lb)   Height: 1.676 m (5\' 6" )     PHYSICAL EXAMINATION:  GENERAL:  Middle-aged woman appearing comfortable, attending clinic with her significant other.  HEENT:  No facial abnormalities noted.  The oropharynx was notable for an absence of salivary pooling.  No parotid enlargement, however.  No articular abnormalities.  LUNGS:  Clear to auscultation.  CARDIAC:  Heart tones S1 and S2.  Regular rhythm.  ABDOMEN:  Soft and nontender.  EXTREMITIES:  No lower extremity swelling.  MUSCULOSKELETAL:  Revealing no swollen or tender joints.  No deformity.  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  The lesions were blanching with palpation.  The nail fold capillaries in the right hand were examined and are entirely normal.    Recent  laboratory results necessary for the safety and efficacy of medications  relevant to her rheumatologic care were reviewed    1. Sjogren's syndrome  Anti - cardiolipin antibody    Beta-2 Glycoprotein Antibodies    Antinuclear antibody screen    Anti - SSA/SSB    Anti rnp / smith    Anti DS-dna AB    Thyroid antibodies    C3 complement    C4 complement    Myositis panel    C reactive protein    Sedimentation rate, automated    CBC and differential    Rheumatoid factor,screen    Cyclic citrullinated peptide    Protein elect with reflex to immunofixation    Hepatitis B & C profile    Lupus Anticoagulant Profile        IMPRESSION AND PLAN:  This is a 52 year old with prior serology suggestive of Sjogren's, who on exam today, 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  I do not believe that those have been characterized yet and she does have a history of clinical manifestations of that condition, fortunately.  We are going to repeat the relevant labs through our system and likely initiate a course of hydroxychloroquine.  She does not recall previously taking it.  I think it is unclear if that may have been the case or not in the prior evaluations that she as in recalls, but we will see how a course of her labs dictates.  We will see her again in the upcoming summer time.             Romie Jumper, MD    DT/MODL  DD:  03/26/2022 07:41:29  DT:  03/26/2022 10:29:08  Job #:  [id number]    cc:

## 2022-03-29 NOTE — Progress Notes (Signed)
This office note has been dictated.

## 2022-05-02 IMAGING — MG DIGITAL SCREENING BILAT W/ TOMO W/ CAD
8 series · 8 of 24 positions shown · non-contrast
Comparison: Previous exam(s).

CLINICAL DATA: Screening.

EXAM:
DIGITAL SCREENING BILATERAL MAMMOGRAM WITH TOMO AND CAD

[R CC synth-2D]
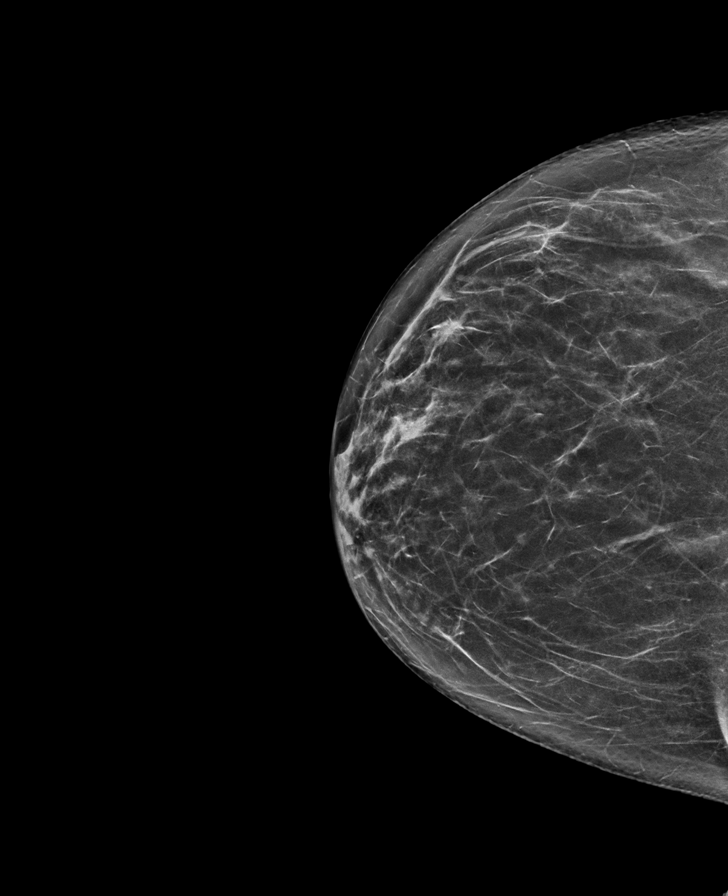

[R MLO synth-2D]
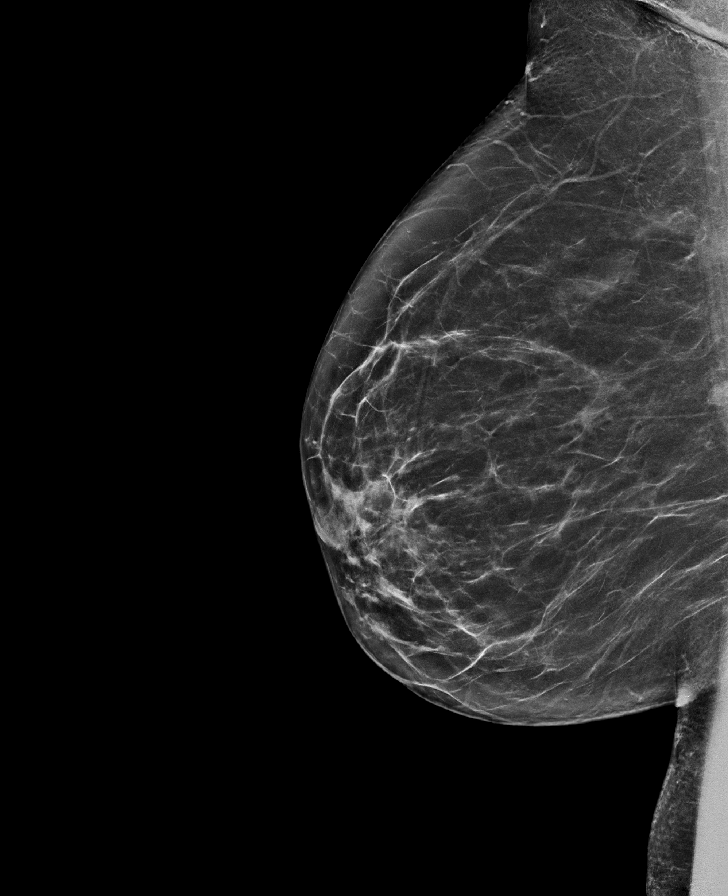

[L MLO synth-2D]
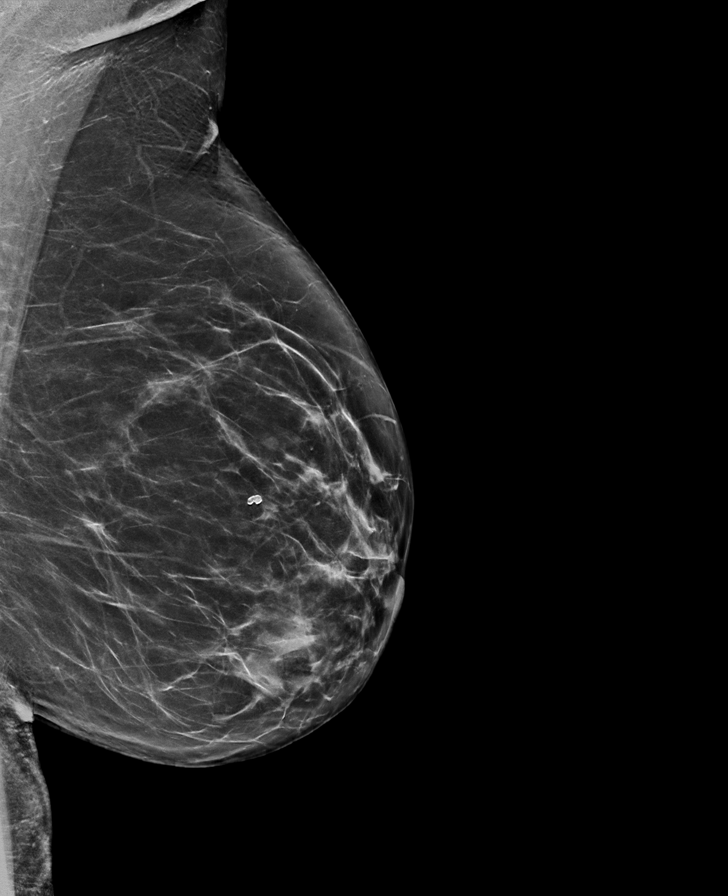

[L CC synth-2D]
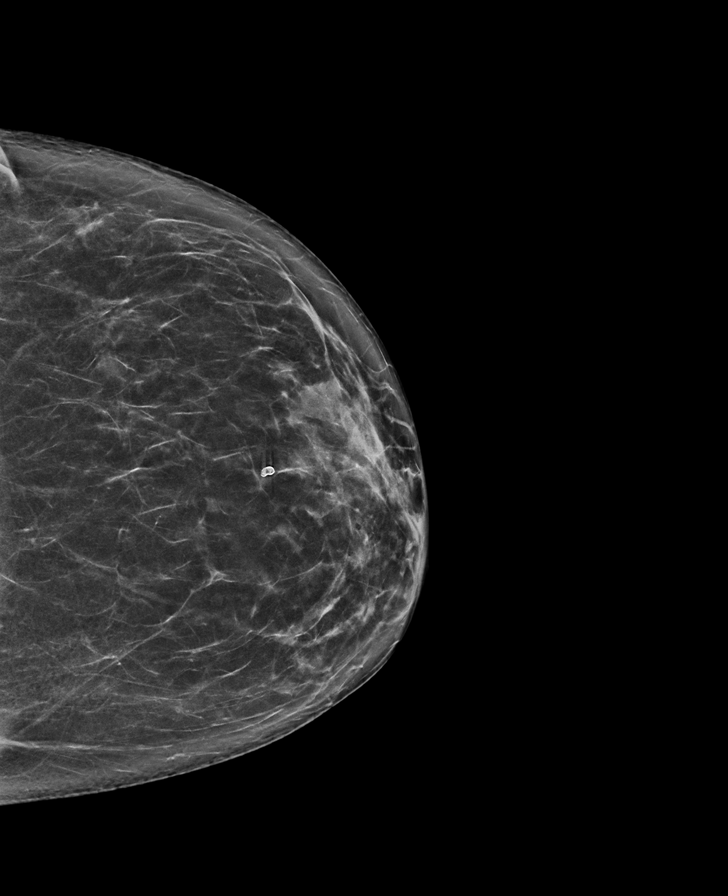

[R CC tomo · tomo slice 37/74.0]
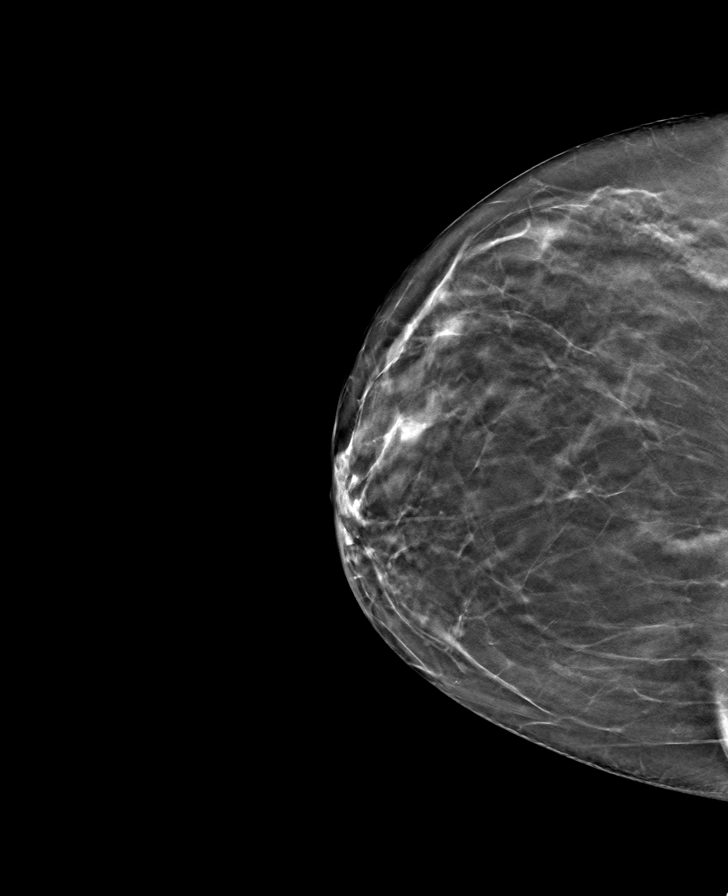

[R MLO tomo · tomo slice 40/79.0]
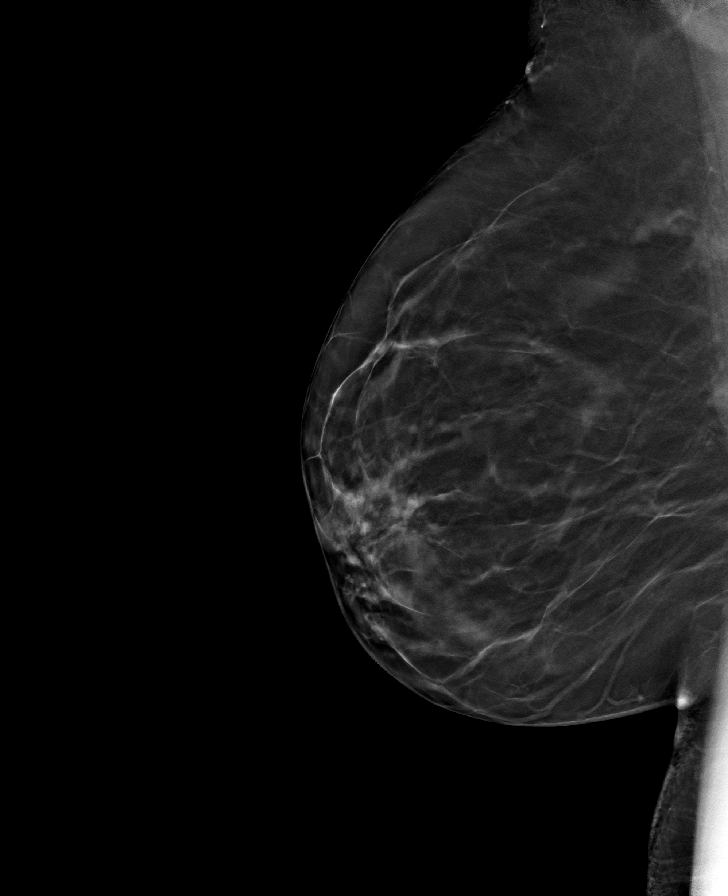

[L MLO tomo · tomo slice 41/80.0]
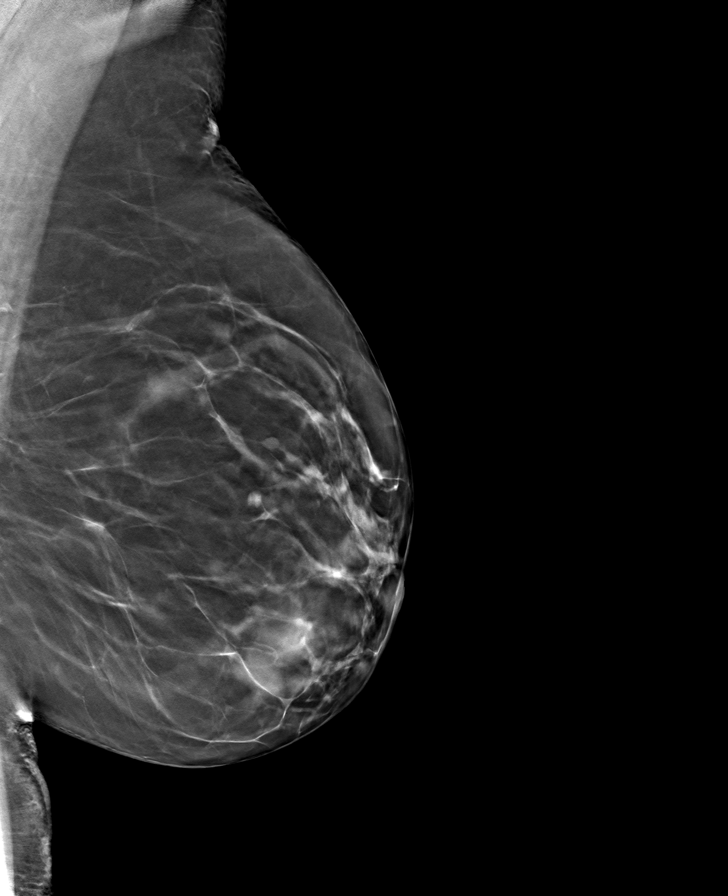

[L CC tomo · tomo slice 37/74.0]
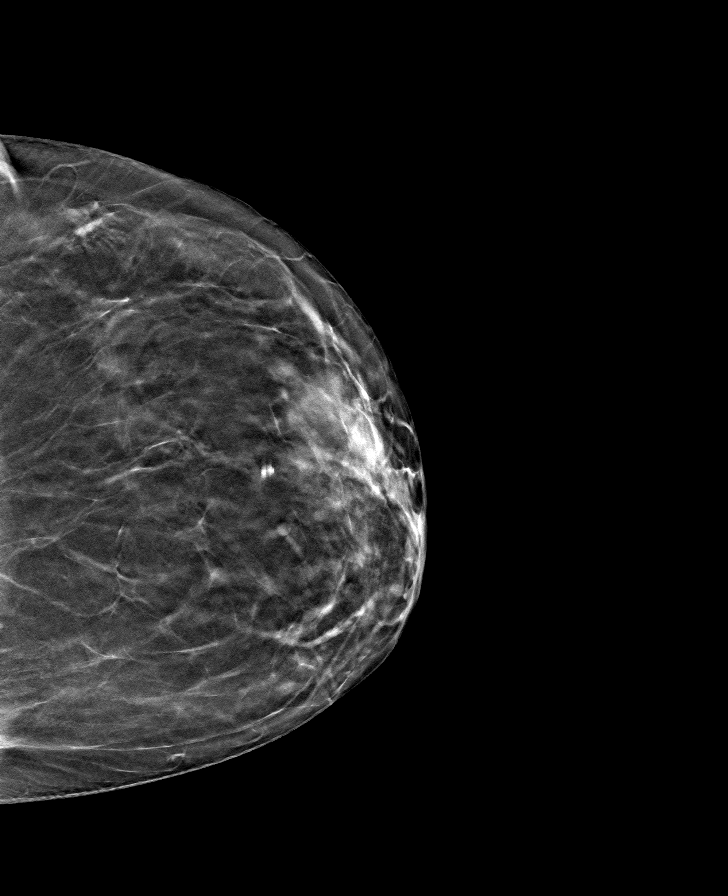

[8 of 24 positions shown; findings below may reference images not displayed]

ACR Breast Density Category b: There are scattered areas of
fibroglandular density.
FINDINGS: There are no findings suspicious for malignancy. Images were
processed with CAD.
IMPRESSION: No mammographic evidence of malignancy. A result letter of this
screening mammogram will be mailed directly to the patient.

RECOMMENDATION:
Screening mammogram in one year. (Code:[id number]-775)

BI-RADS CATEGORY  1: Negative.

## 2022-09-22 ENCOUNTER — Encounter: Payer: Self-pay | Admitting: Rheumatology

## 2022-09-30 ENCOUNTER — Ambulatory Visit: Payer: BLUE CROSS/BLUE SHIELD | Admitting: Rheumatology

## 2022-11-06 ENCOUNTER — Ambulatory Visit: Payer: BLUE CROSS/BLUE SHIELD | Admitting: Rheumatology

## 2022-11-13 ENCOUNTER — Other Ambulatory Visit: Payer: Self-pay

## 2022-11-13 ENCOUNTER — Ambulatory Visit: Payer: BLUE CROSS/BLUE SHIELD | Attending: Rheumatology | Admitting: Rheumatology

## 2022-11-13 VITALS — BP 150/86 | HR 83 | Temp 97.0°F | Ht 66.0 in | Wt 184.0 lb

## 2022-11-13 DIAGNOSIS — M3500 Sicca syndrome, unspecified: Secondary | ICD-10-CM

## 2022-11-13 NOTE — Progress Notes (Signed)
Rheumatology Follow-Up Note     PRIMARY CARE PHYSICIAN: Unknown, Provider, MD    RHEUMATOLOGY SNAPSHOT: ([place])  Livedoid rash triggered immune panel:  ANA 2560 speckled, SSa, SSb pos, pos RF             Subjective   HPI:   Sheila Gonzalez is a 52 y.o. female who is here for follow up of primary Sjogren's Syndrome, last seen by Dr. Lynnda Shields in March of this year as a new patient - historically followed  by rheumatology in [place].      Symptomatically, her condition has caused classic sicca and a possible inflammatory arthritis.  She does have some livedoid like skin features.  No Raynaud's.    Past medications for her condition have included prednisone and methotrexate.  She had not recalled taking hydroxychloroquine, so that was started at her last visit, but this was discontinued due to a lack of significant symptomatic benefit.    Recent labs through our system are consistent with connective tissue disease.    She is feeling generally well - about the same as the time of her last visit - and has minimal concerns or complaints for me today.                  Current Specialty Meds:    naproxen (NAPROSYN) 250 mg tablet [[id number]]                         Objective    PHYSICAL EXAMINATION:  BP 150/86   Pulse 83   Temp 36.1 C (97 F) (Temporal)   Ht 1.676 m (5\' 6" )   Wt 83.5 kg (184 lb)   BMI 29.70 kg/m   Body mass index is 29.7 kg/m.  Pain    11/13/22 1550   PainSc:   0 - No pain     Constitutional:    Well-developed and well-nourished.    Eyes:    Pupils are equal, round, and reactive and conjunctivae appear normal.     HENT:   Normocephalic and atraumatic.     Cardiovascular:    Normal rate and regular rhythm.    Pulmonary:    Effort is normal and breath sounds are normal.    Skin:    Skin is warm and dry.     She has no rash.    Extremities:    There is no edema of the extremities.   Neuro:    She is alert and is oriented.     Musculoskeletal Exam:   Upper and lower extremities  There is no peripheral  joint tenderness, no peripheral joint swelling, no peripheral joint deformity, no abnormal or restricted ROM, no pain with ROM, no enthesitis or tenderness at the entheses and a normal gait.          Jump to joint exam  Joint Exam 11/13/2022     No joint exam has been documented for this visit               PRIOR STUDIES:  Labs:     - Hematology labs        Lab results: 03/25/22  1618   WBC 7.8   Hemoglobin 14.7   Hematocrit 45   Platelets 301   MCV 86   Seg Neut % 74.5   Lymphocyte % 19.0   Monocyte % 5.1   Eosinophil % 0.4   Basophil % 0.5   aPTT 27.9   INR  0.9   Protime 9.9*     - Chemistries labs        Lab results: 03/25/22  1618   Total Protein 8.0*     - Inflammatory markers etc -       Lab results: 03/25/22  1618   Sedimentation Rate 42*   CRP <3     - Rheum serologies        Lab results: 03/25/22  1618   ANA Screen POS*   ANA Titer 2,560*   ANA Pattern SPECKLED   dsDNA Ab 2   Anti-RO/SS-A >8.0*   Anti-LA/SS-B <0.2   Anti-RNP <0.2   Anti-Smith <0.2   KU Antibody Negative   EJ (glycyl-tRNA synthetase) AB Negative   OJ (isoleucyl-tRNA synthetase) AB Negative   PL-7 (threonyl-tRNA synthetase) AB Negative   SRP (Signal Recognition Particle) AB Negative   Rheumatoid Factor POS*   RF Quant 2,048*   Cyclic Citrullin Peptide Ab <1.9   Beta-2 Glyco 1 IgG <1.4   Beta-2 Glyco 1 IgM <1.5   Anticardiolipin IgG <1.6   Anticardiolipin IgM <1.5   Lupus Anticoagulant NEG          Assessment    IMPRESSION:       ICD-10-CM ICD-9-CM    1. Sjogren's syndrome  M35.00 710.2           52 year old female presenting for follow up of her connective tissue disease, serologically consistent with Sjogren's syndrome.  HCQ was not beneficial.  She has no new findings of concern on exam.  We will not proceed with any stronger immunomodulators for now.  Conservative management of her symptoms remains appropriate.  Periodic follow up with our office is requested for general disease oversight.                  RECOMMENDATIONS:    There are no  Patient Instructions on file for this visit.

## 2023-05-26 ENCOUNTER — Ambulatory Visit: Payer: BLUE CROSS/BLUE SHIELD | Admitting: Rheumatology

## 8386-09-13 DEATH — deceased
